# Patient Record
Sex: Male | Born: 1960 | Race: Black or African American | Hispanic: No | Marital: Married | Smoking: Former smoker
Health system: Southern US, Community
[De-identification: ages and names within clinical notes are randomized; demographics above are authoritative.]

## PROBLEM LIST (undated history)

## (undated) DIAGNOSIS — M2550 Pain in unspecified joint: Secondary | ICD-10-CM

## (undated) DIAGNOSIS — F329 Major depressive disorder, single episode, unspecified: Secondary | ICD-10-CM

## (undated) DIAGNOSIS — B356 Tinea cruris: Secondary | ICD-10-CM

## (undated) DIAGNOSIS — Z87448 Personal history of other diseases of urinary system: Secondary | ICD-10-CM

## (undated) DIAGNOSIS — F1011 Alcohol abuse, in remission: Secondary | ICD-10-CM

## (undated) DIAGNOSIS — K219 Gastro-esophageal reflux disease without esophagitis: Secondary | ICD-10-CM

## (undated) DIAGNOSIS — J45909 Unspecified asthma, uncomplicated: Secondary | ICD-10-CM

## (undated) DIAGNOSIS — D684 Acquired coagulation factor deficiency: Secondary | ICD-10-CM

## (undated) DIAGNOSIS — F341 Dysthymic disorder: Secondary | ICD-10-CM

## (undated) HISTORY — DX: Dysthymic disorder: F34.1

## (undated) HISTORY — DX: Personal history of other diseases of urinary system: Z87.448

## (undated) HISTORY — DX: Alcohol abuse, in remission: F10.11

## (undated) HISTORY — DX: Major depressive disorder, single episode, unspecified: F32.9

## (undated) HISTORY — DX: Gastro-esophageal reflux disease without esophagitis: K21.9

## (undated) HISTORY — DX: Pain in unspecified joint: M25.50

## (undated) HISTORY — DX: Tinea cruris: B35.6

## (undated) HISTORY — DX: Acquired coagulation factor deficiency: D68.4

---

## 2005-09-11 ENCOUNTER — Emergency Department (HOSPITAL_COMMUNITY): Admission: EM | Admit: 2005-09-11 | Discharge: 2005-09-11 | Payer: Self-pay | Admitting: Emergency Medicine

## 2006-01-25 ENCOUNTER — Ambulatory Visit: Payer: Self-pay | Admitting: Internal Medicine

## 2006-01-25 LAB — CONVERTED CEMR LAB
Basophils Absolute: 0.1 10*3/uL (ref 0.0–0.1)
Basophils Relative: 1.4 % — ABNORMAL HIGH (ref 0.0–1.0)
CO2: 29 meq/L (ref 19–32)
Calcium: 9.7 mg/dL (ref 8.4–10.5)
Chloride: 106 meq/L (ref 96–112)
Cholesterol: 184 mg/dL (ref 0–200)
Glucose, Bld: 83 mg/dL (ref 70–99)
HDL: 41.3 mg/dL (ref >39.0)
Hemoglobin: 15.2 g/dL (ref 13.0–17.0)
LDL Cholesterol: 129 mg/dL — ABNORMAL HIGH (ref 0–99)
Lymphocytes Relative: 34.1 % (ref 12.0–46.0)
MCV: 90.2 fL (ref 78.0–100.0)
Monocytes Absolute: 0.4 10*3/uL (ref 0.2–0.7)
Monocytes Relative: 10.3 % (ref 3.0–11.0)
Neutrophils Relative %: 49.5 % (ref 43.0–77.0)
Platelets: 194 10*3/uL (ref 150–400)
TSH: 1.01 microintl units/mL (ref 0.35–5.50)
Total CHOL/HDL Ratio: 4.5
Triglycerides: 69 mg/dL (ref 0–149)
WBC: 4.2 10*3/uL — ABNORMAL LOW (ref 4.5–10.5)

## 2006-02-02 ENCOUNTER — Ambulatory Visit: Payer: Self-pay | Admitting: Internal Medicine

## 2006-10-26 ENCOUNTER — Ambulatory Visit: Payer: Self-pay | Admitting: Internal Medicine

## 2007-03-30 ENCOUNTER — Ambulatory Visit: Payer: Self-pay | Admitting: Family Medicine

## 2007-04-03 DIAGNOSIS — J069 Acute upper respiratory infection, unspecified: Secondary | ICD-10-CM | POA: Insufficient documentation

## 2007-04-10 ENCOUNTER — Ambulatory Visit: Payer: Self-pay | Admitting: Internal Medicine

## 2007-05-16 ENCOUNTER — Ambulatory Visit: Payer: Self-pay | Admitting: Internal Medicine

## 2007-05-16 DIAGNOSIS — B356 Tinea cruris: Secondary | ICD-10-CM | POA: Insufficient documentation

## 2007-05-16 HISTORY — DX: Tinea cruris: B35.6

## 2007-07-11 ENCOUNTER — Ambulatory Visit: Payer: Self-pay | Admitting: Internal Medicine

## 2007-10-16 ENCOUNTER — Ambulatory Visit: Payer: Self-pay | Admitting: Internal Medicine

## 2008-02-19 ENCOUNTER — Ambulatory Visit: Payer: Self-pay | Admitting: Internal Medicine

## 2008-03-28 ENCOUNTER — Ambulatory Visit: Payer: Self-pay | Admitting: Internal Medicine

## 2008-03-28 DIAGNOSIS — J309 Allergic rhinitis, unspecified: Secondary | ICD-10-CM | POA: Insufficient documentation

## 2008-05-14 ENCOUNTER — Ambulatory Visit: Payer: Self-pay | Admitting: Family Medicine

## 2008-05-14 DIAGNOSIS — M549 Dorsalgia, unspecified: Secondary | ICD-10-CM | POA: Insufficient documentation

## 2008-09-13 ENCOUNTER — Ambulatory Visit: Payer: Self-pay | Admitting: Family Medicine

## 2008-09-13 ENCOUNTER — Telehealth: Payer: Self-pay | Admitting: Family Medicine

## 2008-09-13 DIAGNOSIS — K219 Gastro-esophageal reflux disease without esophagitis: Secondary | ICD-10-CM | POA: Insufficient documentation

## 2008-09-13 DIAGNOSIS — R079 Chest pain, unspecified: Secondary | ICD-10-CM | POA: Insufficient documentation

## 2008-09-13 HISTORY — DX: Gastro-esophageal reflux disease without esophagitis: K21.9

## 2008-10-23 ENCOUNTER — Ambulatory Visit: Payer: Self-pay | Admitting: Internal Medicine

## 2008-11-06 ENCOUNTER — Ambulatory Visit: Payer: Self-pay | Admitting: Internal Medicine

## 2008-11-06 LAB — CONVERTED CEMR LAB
ALT: 25 units/L (ref 0–53)
AST: 20 units/L (ref 0–37)
BUN: 9 mg/dL (ref 6–23)
Basophils Absolute: 0.1 10*3/uL (ref 0.0–0.1)
Bilirubin, Direct: 0 mg/dL (ref 0.0–0.3)
Blood in Urine, dipstick: NEGATIVE
Calcium: 9.4 mg/dL (ref 8.4–10.5)
Cholesterol: 229 mg/dL — ABNORMAL HIGH (ref 0–200)
Creatinine, Ser: 1 mg/dL (ref 0.4–1.5)
Eosinophils Relative: 6.4 % — ABNORMAL HIGH (ref 0.0–5.0)
GFR calc non Af Amer: 101.82 mL/min (ref >60)
Glucose, Bld: 92 mg/dL (ref 70–99)
Glucose, Urine, Semiquant: NEGATIVE
HDL: 42.5 mg/dL (ref >39.00)
Ketones, urine, test strip: NEGATIVE
Lymphocytes Relative: 29.5 % (ref 12.0–46.0)
Monocytes Relative: 11.7 % (ref 3.0–12.0)
Neutrophils Relative %: 51.1 % (ref 43.0–77.0)
Platelets: 150 10*3/uL (ref 150.0–400.0)
Potassium: 4.2 meq/L (ref 3.5–5.1)
Protein, U semiquant: NEGATIVE
RDW: 12.3 % (ref 11.5–14.6)
TSH: 1.23 microintl units/mL (ref 0.35–5.50)
Total Bilirubin: 1 mg/dL (ref 0.3–1.2)
Triglycerides: 111 mg/dL (ref 0.0–149.0)
VLDL: 22.2 mg/dL (ref 0.0–40.0)
WBC Urine, dipstick: NEGATIVE
WBC: 4.7 10*3/uL (ref 4.5–10.5)
pH: 5.5

## 2008-11-13 ENCOUNTER — Ambulatory Visit: Payer: Self-pay | Admitting: Internal Medicine

## 2008-11-13 DIAGNOSIS — M2550 Pain in unspecified joint: Secondary | ICD-10-CM

## 2008-11-13 DIAGNOSIS — R109 Unspecified abdominal pain: Secondary | ICD-10-CM | POA: Insufficient documentation

## 2008-11-13 HISTORY — DX: Pain in unspecified joint: M25.50

## 2008-11-14 ENCOUNTER — Encounter: Admission: RE | Admit: 2008-11-14 | Discharge: 2008-11-14 | Payer: Self-pay | Admitting: Internal Medicine

## 2008-11-18 ENCOUNTER — Encounter (INDEPENDENT_AMBULATORY_CARE_PROVIDER_SITE_OTHER): Payer: Self-pay | Admitting: *Deleted

## 2008-11-24 ENCOUNTER — Encounter (INDEPENDENT_AMBULATORY_CARE_PROVIDER_SITE_OTHER): Payer: Self-pay | Admitting: *Deleted

## 2008-12-04 ENCOUNTER — Telehealth: Payer: Self-pay | Admitting: Internal Medicine

## 2008-12-16 ENCOUNTER — Ambulatory Visit: Payer: Self-pay | Admitting: Gastroenterology

## 2009-01-07 ENCOUNTER — Ambulatory Visit: Payer: Self-pay | Admitting: Gastroenterology

## 2009-01-07 LAB — HM COLONOSCOPY

## 2009-01-13 ENCOUNTER — Encounter: Payer: Self-pay | Admitting: Gastroenterology

## 2009-01-20 ENCOUNTER — Ambulatory Visit: Payer: Self-pay | Admitting: Internal Medicine

## 2009-01-20 DIAGNOSIS — M65839 Other synovitis and tenosynovitis, unspecified forearm: Secondary | ICD-10-CM | POA: Insufficient documentation

## 2009-01-20 DIAGNOSIS — M65849 Other synovitis and tenosynovitis, unspecified hand: Secondary | ICD-10-CM

## 2009-03-09 ENCOUNTER — Ambulatory Visit: Payer: Self-pay | Admitting: Internal Medicine

## 2009-03-09 DIAGNOSIS — F329 Major depressive disorder, single episode, unspecified: Secondary | ICD-10-CM

## 2009-03-09 DIAGNOSIS — F3289 Other specified depressive episodes: Secondary | ICD-10-CM

## 2009-03-09 DIAGNOSIS — F341 Dysthymic disorder: Secondary | ICD-10-CM

## 2009-03-09 HISTORY — DX: Dysthymic disorder: F34.1

## 2009-03-09 HISTORY — DX: Major depressive disorder, single episode, unspecified: F32.9

## 2009-03-09 HISTORY — DX: Other specified depressive episodes: F32.89

## 2009-05-28 ENCOUNTER — Ambulatory Visit: Payer: Self-pay | Admitting: Internal Medicine

## 2009-05-28 DIAGNOSIS — Z87448 Personal history of other diseases of urinary system: Secondary | ICD-10-CM | POA: Insufficient documentation

## 2009-05-28 DIAGNOSIS — D684 Acquired coagulation factor deficiency: Secondary | ICD-10-CM | POA: Insufficient documentation

## 2009-05-28 DIAGNOSIS — R300 Dysuria: Secondary | ICD-10-CM | POA: Insufficient documentation

## 2009-05-28 HISTORY — DX: Acquired coagulation factor deficiency: D68.4

## 2009-05-28 HISTORY — DX: Personal history of other diseases of urinary system: Z87.448

## 2009-05-28 LAB — CONVERTED CEMR LAB
Bilirubin Urine: NEGATIVE
Ketones, urine, test strip: NEGATIVE
Nitrite: NEGATIVE
Specific Gravity, Urine: 1.01
Urobilinogen, UA: 0.2

## 2009-08-17 ENCOUNTER — Ambulatory Visit: Payer: Self-pay | Admitting: Family Medicine

## 2009-08-17 DIAGNOSIS — S139XXA Sprain of joints and ligaments of unspecified parts of neck, initial encounter: Secondary | ICD-10-CM | POA: Insufficient documentation

## 2009-11-11 ENCOUNTER — Ambulatory Visit: Payer: Self-pay | Admitting: Internal Medicine

## 2009-12-09 ENCOUNTER — Ambulatory Visit: Payer: Self-pay | Admitting: Internal Medicine

## 2010-01-19 ENCOUNTER — Ambulatory Visit
Admission: RE | Admit: 2010-01-19 | Discharge: 2010-01-19 | Payer: Self-pay | Source: Home / Self Care | Attending: Internal Medicine | Admitting: Internal Medicine

## 2010-02-02 NOTE — Assessment & Plan Note (Signed)
Summary: back pain/dm   Vital Signs:  Patient profile:   50 year old male Weight:      189 pounds Temp:     98.0 degrees F oral BP sitting:   110 / 80  (right arm) Cuff size:   regular  Vitals Entered By: Duard Brady LPN (November 11, 2009 9:20 AM) CC: c/o back pain and muscle cramps   no fall no injury Is Patient Diabetic? No   Primary Care Provider:  Gordy Savers  MD  CC:  c/o back pain and muscle cramps   no fall no injury.  History of Present Illness: 50 year old patient presents with a one week history upper interscapular back pain.  He has been on naproxen and has improved.  Pain is aggravated by movement.  He does get to the gym regularly, but he states he does warmup and stretch prior to exercise regimen.  He has had some upper back pain in the past and was seen 3 months ago with neck pain.  He has a history of depression, which has been stable.  He has gastro-esophageal reflux disease  Preventive Screening-Counseling & Management  Alcohol-Tobacco     Smoking Status: never  Allergies (verified): No Known Drug Allergies  Review of Systems  The patient denies anorexia, fever, weight loss, weight gain, vision loss, decreased hearing, hoarseness, chest pain, syncope, dyspnea on exertion, peripheral edema, prolonged cough, headaches, hemoptysis, abdominal pain, melena, hematochezia, severe indigestion/heartburn, hematuria, incontinence, genital sores, muscle weakness, suspicious skin lesions, transient blindness, difficulty walking, depression, unusual weight change, abnormal bleeding, enlarged lymph nodes, angioedema, breast masses, and testicular masses.    Physical Exam  General:  Well-developed,well-nourished,in no acute distress; alert,appropriate and cooperative throughout examination Head:  Normocephalic and atraumatic without obvious abnormalities. No apparent alopecia or balding. Msk:   slight discomfort in the interscapular musculature full range of  motion of the neck and shoulder areas   Impression & Recommendations:  Problem # 1:  BACK PAIN, UPPER (ICD-724.5)  His updated medication list for this problem includes:    Naproxen 500 Mg Tabs (Naproxen) ..... One by mouth two times a day with food as needed pain    Tramadol Hcl 50 Mg Tabs (Tramadol hcl) ..... One every 6 hours for pain    Cyclobenzaprine Hcl 10 Mg Tabs (Cyclobenzaprine hcl) ..... One at bedtime as needed for back pain  Problem # 2:  GERD (ICD-530.81)  His updated medication list for this problem includes:    Aciphex 20 Mg Tbec (Rabeprazole sodium) .Marland Kitchen... 1 once daily  Complete Medication List: 1)  Multivitamins Tabs (Multiple vitamin) .... Take 1 tablet by mouth once a day 2)  Aciphex 20 Mg Tbec (Rabeprazole sodium) .Marland Kitchen.. 1 once daily 3)  Ambi 60pse/400gfn 60-400 Mg Tabs (Pseudoephedrine-guaifenesin) .... One every 12 hours as needed for congestion or allergies 4)  Naproxen 500 Mg Tabs (Naproxen) .... One by mouth two times a day with food as needed pain 5)  Tramadol Hcl 50 Mg Tabs (Tramadol hcl) .... One every 6 hours for pain 6)  Cyclobenzaprine Hcl 10 Mg Tabs (Cyclobenzaprine hcl) .... One at bedtime as needed for back pain  Patient Instructions: 1)  Most patients (90%) with low back pain will improve with time (2-6 weeks). Keep active but avoid activities that are painful. Apply moist heat  to  back several times a day. Prescriptions: CYCLOBENZAPRINE HCL 10 MG TABS (CYCLOBENZAPRINE HCL) one at bedtime as needed for back pain  #30 x  0   Entered and Authorized by:   Gordy Savers  MD   Signed by:   Gordy Savers  MD on 11/11/2009   Method used:   Print then Give to Patient   RxID:   [id number] TRAMADOL HCL 50 MG TABS (TRAMADOL HCL) one every 6 hours for pain  #50 x 2   Entered and Authorized by:   Gordy Savers  MD   Signed by:   Gordy Savers  MD on 11/11/2009   Method used:   Print then Give to Patient   RxID:    [id number] NAPROXEN 500 MG TABS (NAPROXEN) one by mouth two times a day with food as needed pain  #50 x 4   Entered and Authorized by:   Gordy Savers  MD   Signed by:   Gordy Savers  MD on 11/11/2009   Method used:   Print then Give to Patient   RxID:   [id number]    Orders Added: 1)  Est. Patient Level III [84166]

## 2010-02-02 NOTE — Letter (Signed)
Summary: Results Letter  [facility]  [address] [place], [place] [address]   Phone: [phone]  Fax: [phone]        January 13, 2009 MRN: [id number]    [facility] [address] [place], [place]  [address]    Dear Jose [name],   The biopsies taken during your recent upper endoscopy showed no sign of infection or cancer.  You should continue to follow the recommnedations that we discussed at the time of your procedure (that is to continue taking aciphex one pill a day and avoid NSAID type pain medicines that can cause gastritis, ulcers).  Please feel free to call if you have any further questions or concerns.       Sincerely,  Rachael Fee MD  This letter has been electronically signed by your physician.  Appended Document: Results Letter Letter mailed 1.14.11

## 2010-02-02 NOTE — Assessment & Plan Note (Signed)
Summary: consult re: meds for depression/cjr   a a  Vital Signs:  Patient profile:   50 year old male Weight:      187 pounds Temp:     99.0 degrees F oral BP sitting:   92 / 66  (left arm) Cuff size:   regular  Vitals Entered By: Duard Brady LPN (March 09, 1608 3:31 PM) CC: c/o ?depression  Is Patient Diabetic? No   Primary Care Provider:  Gordy Savers  MD  CC:  c/o ?depression .  History of Present Illness: 50 year old patient presents with a several month history of worsening depression.  He and his  wife have  been receiving marital counseling and his therapist felt that he would benefit from an antidepressant.  He describes depressed mood, irritability, and anorexia.  No prior history of depression  Preventive Screening-Counseling & Management  Alcohol-Tobacco     Smoking Status: quit  Allergies (verified): No Known Drug Allergies  Past History:  Past Medical History: history of EtOH substance abuse GERD Abdominal pain Hyperlipidemia  Depression  Review of Systems       The patient complains of depression.  The patient denies anorexia, fever, weight loss, weight gain, vision loss, decreased hearing, hoarseness, chest pain, syncope, dyspnea on exertion, peripheral edema, prolonged cough, headaches, hemoptysis, abdominal pain, melena, hematochezia, severe indigestion/heartburn, hematuria, incontinence, genital sores, muscle weakness, suspicious skin lesions, transient blindness, difficulty walking, unusual weight change, abnormal bleeding, enlarged lymph nodes, angioedema, breast masses, and testicular masses.    Physical Exam  General:  Well-developed,well-nourished,in no acute distress; alert,appropriate and cooperative throughout examination Psych:  Cognition and judgment appear intact. Alert and cooperative with normal attention span and concentration. No apparent delusions, illusions, hallucinations   Impression & Recommendations:  Problem #  1:  DEPRESSION (ICD-311)  His updated medication list for this problem includes:    Sertraline Hcl 50 Mg Tabs (Sertraline hcl) ..... One tablet daily  Problem # 2:  ALLERGIC RHINITIS, SEASONAL, MILD (ICD-477.9)  Complete Medication List: 1)  Multivitamins Tabs (Multiple vitamin) .... Take 1 tablet by mouth once a day 2)  Aciphex 20 Mg Tbec (Rabeprazole sodium) .Marland Kitchen.. 1 once daily 3)  Meloxicam 7.5 Mg Tabs (Meloxicam) .... One daily 4)  Ambi 60pse/400gfn 60-400 Mg Tabs (Pseudoephedrine-guaifenesin) .... One every 12 hours as needed for congestion or allergies 5)  Sertraline Hcl 50 Mg Tabs (Sertraline hcl) .... One tablet daily  Other Orders: Prescription Created Electronically [id number])  Patient Instructions: 1)  Please schedule a follow-up appointment in 6 weeks 2)  It is important that you exercise regularly at least 20 minutes 5 times a week. If you develop chest pain, have severe difficulty breathing, or feel very tired , stop exercising immediately and seek medical attention. Prescriptions: AMBI 60PSE/400GFN 60-400 MG TABS (PSEUDOEPHEDRINE-GUAIFENESIN) one every 12 hours as needed for congestion or allergies  #180 x 3   Entered and Authorized by:   Gordy Savers  MD   Signed by:   Gordy Savers  MD on 03/09/2009   Method used:   Electronically to        [facility] [address] [phone][facility] (retail)       [address]       [place], [place]  [id number]       Ph: [phone] or [phone]       Fax: [phone]   RxID:   [id number] SERTRALINE HCL 50 MG TABS (  SERTRALINE HCL) one tablet daily  #90 x 1   Entered and Authorized by:   Gordy Savers  MD   Signed by:   Gordy Savers  MD on 03/09/2009   Method used:   [facility]        [facility] [address] [phone]* (retail)       [address]       [place], [place]  [id number]       Ph: [phone] or [phone]       Fax: [phone]   RxID:    [id number] AMBI 60PSE/400GFN 60-400 MG TABS (PSEUDOEPHEDRINE-GUAIFENESIN) one every 12 hours as needed for congestion or allergies  #180 x 3   Entered and Authorized by:   Gordy Savers  MD   Signed by:   Gordy Savers  MD on 03/09/2009   Method used:   [facility]        [facility] [facility] [address] [phone]* (retail)       [address]       [place], [place]  [id number]       Ph: [phone] or [phone]       Fax: [phone]   RxID:   [id number] SERTRALINE HCL 50 MG TABS (SERTRALINE HCL) one tablet daily  #90 x 0   Entered and Authorized by:   Gordy Savers  MD   Signed by:   Gordy Savers  MD on 03/09/2009   Method used:   Print then Give to Patient   RxID:   [id number] AMBI 60PSE/400GFN 60-400 MG TABS (PSEUDOEPHEDRINE-GUAIFENESIN) one every 12 hours as needed for congestion or allergies  #180 x 3   Entered and Authorized by:   Gordy Savers  MD   Signed by:   Gordy Savers  MD on 03/09/2009   Method used:   Print then Give to Patient   RxID:   [id number]

## 2010-02-02 NOTE — Procedures (Signed)
Summary: Upper Endoscopy  Patient: Jose [name] Note: All result statuses are Final unless otherwise noted.  Tests: (1) Upper Endoscopy (EGD)   EGD Upper Endoscopy       DONE     [facility]     [address]     [place], [place]  [address]           ENDOSCOPY PROCEDURE REPORT           PATIENT:  Jose [name], Jose [name]  MR#:  [id number]     BIRTHDATE:  03-30-1960, 48 yrs. old  GENDER:  male           ENDOSCOPIST:  Rachael Fee, MD     PROCEDURE DATE:  01/07/2009     PROCEDURE:  EGD with biopsy     ASA CLASS:  Class II     INDICATIONS:  GERD, dyspepsia           MEDICATIONS:  Fentanyl 25 mcg IV, Versed 2 mg IV, there was     residual sedation effect present from prior procedure     TOPICAL ANESTHETIC:  none           DESCRIPTION OF PROCEDURE:   After the risks benefits and     alternatives of the procedure were thoroughly explained, informed     consent was obtained.  The LB [id number]-H180 [id number] endoscope was     introduced through the mouth and advanced to the second portion of     the duodenum, without limitations.  The instrument was slowly     withdrawn as the mucosa was fully examined.     <<PROCEDUREIMAGES>>           There was mild, non-specific distal gastritis. This was biopsied     to check for H. pylori (see image1).  Otherwise the examination     was normal (see image6, image5, image3, and image2).     Retroflexed views revealed no abnormalities.    The scope was then     withdrawn from the patient and the procedure completed.           COMPLICATIONS:  None           ENDOSCOPIC IMPRESSION:     1) Mild, non-specific gastritis; biopsies taken to check for H.     pylori     2) Otherwise normal examination           RECOMMENDATIONS:     Continue taking aciphex once daily.  Cutting back on NSAID type     medicines will also probably help (over the counter pain medicines     except tylenol).     If biopsies show H. pylori, he will be started on the  appropriate antibiotics.           ______________________________     Rachael Fee, MD           cc: Eleonore Chiquito, MD           n.     eSIGNED:   Rachael Fee at 01/07/2009 03:00 PM           Roque Lias, [id number]  Note: An exclamation mark (!) indicates a result that was not dispersed into the flowsheet. Document Creation Date: 01/07/2009 3:00 PM _______________________________________________________________________  (1) Order result status: Final Collection or observation date-time: 01/07/2009 14:55 Requested date-time:  Receipt date-time:  Reported date-time:  Referring Physician:   Ordering Physician: Rob Bunting [phone]) Specimen Source:  Source: Launa Grill Order Number: [id number] Lab site:

## 2010-02-02 NOTE — Assessment & Plan Note (Signed)
Summary: blood in urine/cjr   Vital Signs:  Patient profile:   50 year old male Weight:      184 pounds Temp:     98.0 degrees F oral BP sitting:   110 / 74  (left arm) Cuff size:   regular  Vitals Entered By: Duard Brady LPN (May 28, 2009 11:18 AM) CC: c/o blood in urine noted since  Is Patient Diabetic? No   Primary Care Provider:  Gordy Savers  MD  CC:  c/o blood in urine noted since .  History of Present Illness: 50 year old patient was seen today concerned about possible hematuria.  He noted his urine yesterday was quite dark but improves with forcing fluids.  He states that 15 years ago, he had an evaluation for hematuria and he was concerned that this had re-occurred.  He denied  any abdominal or flank pain.  Denies any history of kidney stones.  He states his ear and again looked dark.  This morning, but has cleared.  A urinalysis was reviewed today and was normal. He has a history of depression, but self discontinued medication the to a feeling of somnolence.  He has felt well since discontinuation and does not feel depressed at this  time.  Preventive Screening-Counseling & Management  Alcohol-Tobacco     Smoking Status: never  Allergies (verified): No Known Drug Allergies  Social History: Smoking Status:  never  Physical Exam  General:  Well-developed,well-nourished,in no acute distress; alert,appropriate and cooperative throughout examination; normal blood pressure Abdomen:  no abdominal or flank tenderness   Impression & Recommendations:  Problem # 1:  HEMATURIA, HX OF (ICD-V13.09) doubt this represents true hematuria, probably just more concentrated urine.  Will check a UA at his next routine office visit  Problem # 2:  DEPRESSION (ICD-311)  His updated medication list for this problem includes:    Sertraline Hcl 50 Mg Tabs (Sertraline hcl) ..... One tablet daily stable off medication  His updated medication list for this problem  includes:    Sertraline Hcl 50 Mg Tabs (Sertraline hcl) ..... One tablet daily  Complete Medication List: 1)  Multivitamins Tabs (Multiple vitamin) .... Take 1 tablet by mouth once a day 2)  Aciphex 20 Mg Tbec (Rabeprazole sodium) .Marland Kitchen.. 1 once daily 3)  Meloxicam 7.5 Mg Tabs (Meloxicam) .... One daily 4)  Ambi 60pse/400gfn 60-400 Mg Tabs (Pseudoephedrine-guaifenesin) .... One every 12 hours as needed for congestion or allergies 5)  Sertraline Hcl 50 Mg Tabs (Sertraline hcl) .... One tablet daily  Other Orders: UA Dipstick w/o Micro (manual) ([address])  Patient Instructions: 1)  Please schedule a follow-up appointment as needed.  Laboratory Results   Urine Tests  Date/Time Received: May 28, 2009 12:25 PM  Date/Time Reported: May 28, 2009 12:25 PM   Routine Urinalysis   Color: straw Appearance: Clear Glucose: negative   (Normal Range: Negative) Bilirubin: negative   (Normal Range: Negative) Ketone: negative   (Normal Range: Negative) Spec. Gravity: 1.010   (Normal Range: 1.003-1.035) Blood: trace-lysed   (Normal Range: Negative) pH: 7.5   (Normal Range: 5.0-8.0) Protein: trace   (Normal Range: Negative) Urobilinogen: 0.2   (Normal Range: 0-1) Nitrite: negative   (Normal Range: Negative) Leukocyte Esterace: negative   (Normal Range: Negative)

## 2010-02-02 NOTE — Assessment & Plan Note (Signed)
Summary: consult re: right wrist pain/results from colonoscopy/cjr   Vital Signs:  Patient profile:   50 year old male Weight:      183 pounds BMI:     26.35 Temp:     99.0 degrees F oral BP sitting:   82 / 60  (left arm) Cuff size:   regular  Vitals Entered By: Raechel Ache, RN (January 20, 2009 1:31 PM)  Procedure Note  Injections: Date of onset: 11/08/2005 Duration of symptoms: 2 months Indication: chronic pain Consent signed: no  Procedure # 1: tendon sheath injection    Region: lateral    Location: right forearm    Technique: 24 g needle    Medication: 20 mg depomedrol    Anesthesia: 1% lidocaine w/o epinephrine    Comment: tolerated procedure well  Cleaned and prepped with: alcohol Wound dressing: neosporin and bandaid  CC: C/o R wrist pain Is Patient Diabetic? No   Primary Care Provider:  Gordy Savers  MD  CC:  C/o R wrist pain.  History of Present Illness:  50 year old patient who has a history of right wrist tendinitis is seen today complaining of persistent pain.  He has been evaluated  by rheumatology and has been prescribed a wrist brace, which has been helpful.  This is his dominant hand.  Pain is localized to the lateral aspect of the wrist.  Allergies: No Known Drug Allergies  Physical Exam  General:  Well-developed,well-nourished,in no acute distress; alert,appropriate and cooperative throughout examination Msk:  tenderness along the right lateral wrist area   Impression & Recommendations:  Problem # 1:  TENDINITIS, RIGHT WRIST (ICD-727.05)  Orders: Depo-Medrol 20mg  (J1020) Injection, Tendon / Ligament ([address])  Complete Medication List: 1)  Multivitamins Tabs (Multiple vitamin) .... Take 1 tablet by mouth once a day 2)  Aciphex 20 Mg Tbec (Rabeprazole sodium) .Marland Kitchen.. 1 once daily 3)  Meloxicam 7.5 Mg Tabs (Meloxicam) .... One daily  Patient Instructions: 1)  continues to use right wrist brace 2)  Take 400-600mg  of Ibuprofen  (Advil, Motrin) with food every 4-6 hours as needed for relief of pain or comfort of fever. 3)  Please schedule a follow-up appointment as needed. Prescriptions: MELOXICAM 7.5 MG TABS (MELOXICAM) one daily  #50 x 4   Entered and Authorized by:   Gordy Savers  MD   Signed by:   Gordy Savers  MD on 01/20/2009   Method used:   Print then Give to Patient   RxID:   [id number] ACIPHEX 20 MG TBEC (RABEPRAZOLE SODIUM) 1 once daily  #90 x 3   Entered and Authorized by:   Gordy Savers  MD   Signed by:   Gordy Savers  MD on 01/20/2009   Method used:   Print then Give to Patient   RxID:   [id number] ACIPHEX 20 MG TBEC (RABEPRAZOLE SODIUM) 1 once daily  #14 x 0   Entered and Authorized by:   Gordy Savers  MD   Signed by:   Gordy Savers  MD on 01/20/2009   Method used:   Print then Give to Patient   RxID:   [id number]

## 2010-02-02 NOTE — Assessment & Plan Note (Signed)
Summary: back pain/cjr   Vital Signs:  Patient profile:   50 year old male Weight:      191 pounds Temp:     98.4 degrees F oral BP sitting:   100 / 70  (right arm) Cuff size:   regular CC: c/o back pain getting worse   Is Patient Diabetic? No   Primary Care Provider:  Gordy Savers  MD  CC:  c/o back pain getting worse  .  History of Present Illness: 50 year old patient seen today for  follow-up of his low back pain.  He went to the gym 4 days ago, and the soft Monday, had some mild left upper back pain beneath the left scapula.  His been essentially pain-free for the past two days.  He has a history of depression, which has been stable.  No other concerns or complaints.  He does have some medications on hand for flareups of low back pain.  These were used and were very effective  Allergies (verified): No Known Drug Allergies  Past History:  Past Medical History: Reviewed history from 03/09/2009 and no changes required. history of EtOH substance abuse GERD Abdominal pain Hyperlipidemia  Depression  Review of Systems  The patient denies anorexia, fever, weight loss, weight gain, vision loss, decreased hearing, hoarseness, chest pain, syncope, dyspnea on exertion, peripheral edema, prolonged cough, headaches, hemoptysis, abdominal pain, melena, hematochezia, severe indigestion/heartburn, hematuria, incontinence, genital sores, muscle weakness, suspicious skin lesions, transient blindness, difficulty walking, depression, unusual weight change, abnormal bleeding, enlarged lymph nodes, angioedema, breast masses, and testicular masses.    Physical Exam  General:  Well-developed,well-nourished,in no acute distress; alert,appropriate and cooperative throughout examination Head:  Normocephalic and atraumatic without obvious abnormalities. No apparent alopecia or balding. Mouth:  Oral mucosa and oropharynx without lesions or exudates.  Teeth in good repair. Neck:  No  deformities, masses, or tenderness noted. Lungs:  Normal respiratory effort, chest expands symmetrically. Lungs are clear to auscultation, no crackles or wheezes. Heart:  Normal rate and regular rhythm. S1 and S2 normal without gallop, murmur, click, rub or other extra sounds. Abdomen:  Bowel sounds positive,abdomen soft and non-tender without masses, organomegaly or hernias noted.   Impression & Recommendations:  Problem # 1:  BACK PAIN, UPPER (ICD-724.5)  His updated medication list for this problem includes:    Naproxen 500 Mg Tabs (Naproxen) ..... One by mouth two times a day with food as needed pain    Tramadol Hcl 50 Mg Tabs (Tramadol hcl) ..... One every 6 hours for pain    Cyclobenzaprine Hcl 10 Mg Tabs (Cyclobenzaprine hcl) ..... One at bedtime as needed for back pain  His updated medication list for this problem includes:    Naproxen 500 Mg Tabs (Naproxen) ..... One by mouth two times a day with food as needed pain    Tramadol Hcl 50 Mg Tabs (Tramadol hcl) ..... One every 6 hours for pain    Cyclobenzaprine Hcl 10 Mg Tabs (Cyclobenzaprine hcl) ..... One at bedtime as needed for back pain  Problem # 2:  ANXIETY DEPRESSION (ICD-300.4)  Complete Medication List: 1)  Multivitamins Tabs (Multiple vitamin) .... Take 1 tablet by mouth once a day 2)  Aciphex 20 Mg Tbec (Rabeprazole sodium) .Marland Kitchen.. 1 once daily 3)  Ambi 60pse/400gfn 60-400 Mg Tabs (Pseudoephedrine-guaifenesin) .... One every 12 hours as needed for congestion or allergies 4)  Naproxen 500 Mg Tabs (Naproxen) .... One by mouth two times a day with food as needed pain  5)  Tramadol Hcl 50 Mg Tabs (Tramadol hcl) .... One every 6 hours for pain 6)  Cyclobenzaprine Hcl 10 Mg Tabs (Cyclobenzaprine hcl) .... One at bedtime as needed for back pain  Patient Instructions: 1)  Please schedule a follow-up appointment in 4 months for CPX 2)  It is important that you exercise regularly at least 20 minutes 5 times a week. If you develop  chest pain, have severe difficulty breathing, or feel very tired , stop exercising immediately and seek medical attention.   Orders Added: 1)  Est. Patient Level III [[address]]

## 2010-02-02 NOTE — Procedures (Signed)
Summary: Colonoscopy  Patient: [name] Note: All result statuses are Final unless otherwise noted.  Tests: (1) Colonoscopy (COL)   COL Colonoscopy           DONE     [facility]     [address]     [place], [place]  [address]           COLONOSCOPY PROCEDURE REPORT           PATIENT:  Jose [name], Jose [name]  MR#:  [id number]     BIRTHDATE:  05-21-1960, 48 yrs. old  GENDER:  male           ENDOSCOPIST:  Rachael Fee, MD     Referred by:  Eleonore Chiquito, M.D.           PROCEDURE DATE:  01/07/2009     PROCEDURE:  Colonoscopy, Diagnostic     ASA CLASS:  Class II     INDICATIONS:  constipation           MEDICATIONS:   Fentanyl 50 mcg IV, Versed 5 mg IV           DESCRIPTION OF PROCEDURE:   After the risks benefits and     alternatives of the procedure were thoroughly explained, informed     consent was obtained.  Digital rectal exam was performed and     revealed no rectal masses.   The LB CF-[id number] endoscope     was introduced through the anus and advanced to the cecum, which     was identified by both the appendix and ileocecal valve, without     limitations.  The quality of the prep was good, using MoviPrep.     The instrument was then slowly withdrawn as the colon was fully     examined.     <<PROCEDUREIMAGES>>           FINDINGS:  A normal appearing cecum, ileocecal valve, and     appendiceal orifice were identified. The ascending, hepatic     flexure, transverse, splenic flexure, descending, sigmoid colon,     and rectum appeared unremarkable (see image1, image3, and image4).     Retroflexed views in the rectum revealed no abnormalities.    The     scope was then withdrawn from the patient and the procedure     completed.           COMPLICATIONS:  None           ENDOSCOPIC IMPRESSION:     1) Normal colon     2) No polyps or cancers           RECOMMENDATIONS:     Continue current colorectal screening recommendations for     "routine  risk" patients with a repeat colonoscopy in 10 years.     Try daily fiber supplement (citrucel) for your mild     constipation.           REPEAT EXAM:  10 years           ______________________________     Rachael Fee, MD           n.     eSIGNED:   Rachael Fee at 01/07/2009 02:51 PM           Roque Lias, [id number]  Note: An exclamation mark (!) indicates a result that was not dispersed into the flowsheet. Document Creation Date: 01/07/2009 2:51 PM _______________________________________________________________________  (1) Order result  status: Final Collection or observation date-time: 01/07/2009 14:47 Requested date-time:  Receipt date-time:  Reported date-time:  Referring Physician:   Ordering Physician: Rob Bunting [phone]) Specimen Source:  Source: Launa Grill Order Number: [id number] Lab site:   Appended Document: Colonoscopy    Clinical Lists Changes  Observations: Added new observation of COLONNXTDUE: 01/2019 (01/07/2009 15:16)

## 2010-02-02 NOTE — Assessment & Plan Note (Signed)
Summary: neck pain//ccm   Vital Signs:  Patient profile:   50 year old male Temp:     97.8 degrees F oral BP sitting:   110 / 80  (left arm) Cuff size:   regular  Vitals Entered By: Sid Falcon LPN (August 17, 2009 11:59 AM) CC: Right neck pain X 2 days   History of Present Illness: 2 day hx of L neck pain.   Pain correlates with sternocleidomastoid muscle. No injury.  Achy quality.  Worse with swallowing but no dysphagia.  Moderated severity. Has not taken any meds.  No appetite or weight change.  No fever.  Allergies (verified): No Known Drug Allergies  Past History:  Past Medical History: Last updated: 03/09/2009 history of EtOH substance abuse GERD Abdominal pain Hyperlipidemia  Depression PMH reviewed for relevance  Review of Systems  The patient denies anorexia, fever, weight loss, hoarseness, chest pain, dyspnea on exertion, and headaches.    Physical Exam  General:  Well-developed,well-nourished,in no acute distress; alert,appropriate and cooperative throughout examination Head:  no visible edema. Ears:  External ear exam shows no significant lesions or deformities.  Otoscopic examination reveals clear canals, tympanic membranes are intact bilaterally without bulging, retraction, inflammation or discharge. Hearing is grossly normal bilaterally. Mouth:  Oral mucosa and oropharynx without lesions or exudates.  Teeth in good repair. Neck:  slightl tender sternocleidomasoid muscle.  No adenopathy or any other mass. Lungs:  Normal respiratory effort, chest expands symmetrically. Lungs are clear to auscultation, no crackles or wheezes. Heart:  Normal rate and regular rhythm. S1 and S2 normal without gallop, murmur, click, rub or other extra sounds. Skin:  no rashes.   Cervical Nodes:  No lymphadenopathy noted   Impression & Recommendations:  Problem # 1:  CERVICAL MUSCLE STRAIN (ICD-847.0) try topical rub and Naproxen. The following medications were removed  from the medication list:    Meloxicam 7.5 Mg Tabs (Meloxicam) ..... One daily His updated medication list for this problem includes:    Naproxen 500 Mg Tabs (Naproxen) ..... One by mouth two times a day with food as needed pain  Complete Medication List: 1)  Multivitamins Tabs (Multiple vitamin) .... Take 1 tablet by mouth once a day 2)  Aciphex 20 Mg Tbec (Rabeprazole sodium) .Marland Kitchen.. 1 once daily 3)  Ambi 60pse/400gfn 60-400 Mg Tabs (Pseudoephedrine-guaifenesin) .... One every 12 hours as needed for congestion or allergies 4)  Naproxen 500 Mg Tabs (Naproxen) .... One by mouth two times a day with food as needed pain  Patient Instructions: 1)  Consider use of topical rub such as Icy Hot 2)  Follow up with your primary if no better in 2 weeks. Prescriptions: NAPROXEN 500 MG TABS (NAPROXEN) one by mouth two times a day with food as needed pain  #30 x 0   Entered and Authorized by:   Evelena Peat MD   Signed by:   Evelena Peat MD on 08/17/2009   Method used:   [facility]  [address] [id number]       [address]       [place], [place]  [address]       Ph: [phone] or [phone]       Fax: [phone]   RxID:   [id number]

## 2010-02-04 NOTE — Assessment & Plan Note (Signed)
Summary: RASH // RS   Vital Signs:  Patient profile:   50 year old male Weight:      189 pounds Temp:     98.6 degrees F oral BP sitting:   102 / 78  Vitals Entered By: Lynann Beaver CMA AAMA (January 19, 2010 2:55 PM) CC: rash Is Patient Diabetic? No Pain Assessment Patient in pain? no        Primary Care Provider:  Gordy Savers  MD  CC:  rash.  History of Present Illness: 68 -year-old patient who is seen to chief complaint of a rash in the inguinal region.This started yesterday and he has been using some topical medication and actually feels it is improved.  Today.  He has had a history of dermatophytosis involving the groin and perineal area.  Otherwise, doing quite well.  He is presently not taken any chronic medication.  He has a history of depression, which has been stable  Current Medications (verified): 1)  Ambi 60pse/400gfn 60-400 Mg Tabs (Pseudoephedrine-Guaifenesin) .... One Every 12 Hours As Needed For Congestion or Allergies  Allergies (verified): No Known Drug Allergies  Past History:  Past Medical History: Reviewed history from 03/09/2009 and no changes required. history of EtOH substance abuse GERD Abdominal pain Hyperlipidemia  Depression  Past Surgical History: Reviewed history from 12/16/2008 and no changes required. Denies surgical history   Review of Systems       The patient complains of suspicious skin lesions.  The patient denies anorexia, fever, weight loss, weight gain, vision loss, decreased hearing, hoarseness, chest pain, syncope, dyspnea on exertion, peripheral edema, prolonged cough, headaches, hemoptysis, abdominal pain, hematochezia, severe indigestion/heartburn, hematuria, incontinence, genital sores, muscle weakness, transient blindness, difficulty walking, depression, unusual weight change, abnormal bleeding, enlarged lymph nodes, angioedema, breast masses, and testicular masses.    Physical Exam  General:   Well-developed,well-nourished,in no acute distress; alert,appropriate and cooperative throughout examination Skin:  mild areas of hyperpigmentation in the intertriginous areas of the groin especially junction of the left upper inner thigh and scrotal region   Impression & Recommendations:  Problem # 1:  DERMATOPHYTOSIS OF GROIN AND PERIANAL AREA (ICD-110.3)  Problem # 2:  ANXIETY DEPRESSION (ICD-300.4) stable  Complete Medication List: 1)  Ambi 60pse/400gfn 60-400 Mg Tabs (Pseudoephedrine-guaifenesin) .... One every 12 hours as needed for congestion or allergies 2)  Nystatin-triamcinolone 100000-0.1 Unit/gm-% Crea (Nystatin-triamcinolone) .... Apply  twice daily  Patient Instructions: 1)  Please schedule a follow-up appointment as needed. Prescriptions: NYSTATIN-TRIAMCINOLONE 100000-0.1 UNIT/GM-% CREA (NYSTATIN-TRIAMCINOLONE) apply  twice daily  #60 gm x 3   Entered and Authorized by:   Gordy Savers  MD   Signed by:   Gordy Savers  MD on 01/19/2010   Method used:   [facility] [address] [id number]       [address]       [place], [place]  [address]       Ph: [phone] or [phone]       Fax: [phone]   RxID:   [id number] NYSTATIN-TRIAMCINOLONE 100000-0.1 UNIT/GM-% CREA (NYSTATIN-TRIAMCINOLONE) apply  twice daily  #60 gm x 0   Entered and Authorized by:   Gordy Savers  MD   Signed by:   Gordy Savers  MD on 01/19/2010   Method used:   Print then Give to Patient   RxID:   [id number]    Orders Added: 1)  Est. Patient Level III [[address]]

## 2010-02-18 ENCOUNTER — Telehealth: Payer: Self-pay

## 2010-02-18 NOTE — Telephone Encounter (Signed)
Mucinex DM 1 po BID prn cough and congestion #30 [facility]RF [facility]> KIK

## 2010-02-18 NOTE — Telephone Encounter (Signed)
What medication is no longer made??

## 2010-02-18 NOTE — Telephone Encounter (Signed)
Requesting new med - AMBI no longer made.   Please advise

## 2010-02-18 NOTE — Telephone Encounter (Signed)
This is the generic of  Mucinex D which is available over-the-counter;  Okay to call in Rx for number 30, refill x 3

## 2010-02-18 NOTE — Telephone Encounter (Signed)
AMBI60PSE/400GFN - it is listed in his centricity med list

## 2010-02-19 ENCOUNTER — Telehealth: Payer: Self-pay | Admitting: Internal Medicine

## 2010-02-19 NOTE — Telephone Encounter (Signed)
Pt called to adv that [facility] [address] adv pt that they no longer carry med: ambi.... Will need replacement med / Rx sent to pharmacy.

## 2010-02-19 NOTE — Telephone Encounter (Signed)
Called pt - informed that we called pharmacy yesterday - mucinex d would be the replacement for [facility] - gave order , but since OTC , they/ins may not fill. KIK

## 2010-03-30 ENCOUNTER — Encounter: Payer: Self-pay | Admitting: Internal Medicine

## 2010-03-30 ENCOUNTER — Ambulatory Visit (INDEPENDENT_AMBULATORY_CARE_PROVIDER_SITE_OTHER): Payer: 59 | Admitting: Internal Medicine

## 2010-03-30 VITALS — BP 110/74 | Temp 98.4°F | Ht 70.0 in | Wt 190.0 lb

## 2010-03-30 DIAGNOSIS — J069 Acute upper respiratory infection, unspecified: Secondary | ICD-10-CM

## 2010-03-30 NOTE — Progress Notes (Signed)
  Subjective:    Patient ID: Jose [name], male    DOB: 04-13-1960, 50 y.o.   MRN: [id number]  HPI   50 year old patient who presents with a five-day history of head and chest congestion. He has been taking Mucinex DM as well as Mucinex D. He feels there's been low-grade fever. There's been little sputum production that is fairly clear. No chest pain shortness of breath wheezing.   Review of Systems  Constitutional: Negative for fever, chills, appetite change and fatigue.  HENT: Positive for congestion. Negative for hearing loss, ear pain, sore throat, trouble swallowing, neck stiffness, dental problem, voice change and tinnitus.   Eyes: Negative for pain, discharge and visual disturbance.  Respiratory: Positive for cough. Negative for chest tightness, wheezing and stridor.   Cardiovascular: Negative for chest pain, palpitations and leg swelling.  Gastrointestinal: Negative for nausea, vomiting, abdominal pain, diarrhea, constipation, blood in stool and abdominal distention.  Genitourinary: Negative for urgency, hematuria, flank pain, discharge, difficulty urinating and genital sores.  Musculoskeletal: Negative for myalgias, back pain, joint swelling, arthralgias and gait problem.  Skin: Negative for rash.  Neurological: Negative for dizziness, syncope, speech difficulty, weakness, numbness and headaches.  Hematological: Negative for adenopathy. Does not bruise/bleed easily.  Psychiatric/Behavioral: Negative for behavioral problems and dysphoric mood. The patient is not nervous/anxious.        Objective:   Physical Exam  Constitutional: He is oriented to person, place, and time. He appears well-developed.  HENT:  Head: Normocephalic.  Right Ear: External ear normal.  Left Ear: External ear normal.  Eyes: Conjunctivae and EOM are normal.  Neck: Normal range of motion.  Cardiovascular: Normal rate and normal heart sounds.   Pulmonary/Chest: Breath sounds normal.  Abdominal: Bowel  sounds are normal.  Musculoskeletal: Normal range of motion. He exhibits no edema and no tenderness.  Neurological: He is alert and oriented to person, place, and time.  Psychiatric: He has a normal mood and affect. His behavior is normal.          Assessment & Plan:   viral URI. We'll continue the Mucinex DM. If this is not effective will switch to [employer]

## 2010-03-30 NOTE — Patient Instructions (Signed)
Get plenty of rest, Drink lots of  clear liquids, and use Tylenol or ibuprofen for fever and discomfort.    Call or return to clinic prn if these symptoms worsen or fail to improve as anticipated.  

## 2010-04-07 ENCOUNTER — Other Ambulatory Visit (INDEPENDENT_AMBULATORY_CARE_PROVIDER_SITE_OTHER): Payer: 59 | Admitting: Internal Medicine

## 2010-04-07 DIAGNOSIS — Z0000 Encounter for general adult medical examination without abnormal findings: Secondary | ICD-10-CM

## 2010-04-07 DIAGNOSIS — E785 Hyperlipidemia, unspecified: Secondary | ICD-10-CM

## 2010-04-07 LAB — POCT URINALYSIS DIPSTICK
Bilirubin, UA: NEGATIVE
Glucose, UA: NEGATIVE
Leukocytes, UA: NEGATIVE
Nitrite, UA: NEGATIVE
pH, UA: 5

## 2010-04-07 LAB — BASIC METABOLIC PANEL
Calcium: 9.5 mg/dL (ref 8.4–10.5)
GFR: 94.66 mL/min (ref >60.00)
Glucose, Bld: 78 mg/dL (ref 70–99)
Potassium: 4.6 mEq/L (ref 3.5–5.1)
Sodium: 141 mEq/L (ref 135–145)

## 2010-04-07 LAB — CBC WITH DIFFERENTIAL/PLATELET
Basophils Relative: 0.9 % (ref 0.0–3.0)
Eosinophils Relative: 4.9 % (ref 0.0–5.0)
HCT: 44.2 % (ref 39.0–52.0)
Hemoglobin: 15 g/dL (ref 13.0–17.0)
Lymphocytes Relative: 29.9 % (ref 12.0–46.0)
Lymphs Abs: 2 10*3/uL (ref 0.7–4.0)
Monocytes Relative: 10.7 % (ref 3.0–12.0)
Neutro Abs: 3.6 10*3/uL (ref 1.4–7.7)
RBC: 4.76 Mil/uL (ref 4.22–5.81)

## 2010-04-07 LAB — LDL CHOLESTEROL, DIRECT: Direct LDL: 160.9 mg/dL

## 2010-04-07 LAB — LIPID PANEL
Cholesterol: 218 mg/dL — ABNORMAL HIGH (ref 0–200)
VLDL: 19 mg/dL (ref 0.0–40.0)

## 2010-04-07 LAB — HEPATIC FUNCTION PANEL
ALT: 26 U/L (ref 0–53)
AST: 23 U/L (ref 0–37)
Albumin: 4 g/dL (ref 3.5–5.2)
Alkaline Phosphatase: 51 U/L (ref 39–117)
Total Protein: 6.9 g/dL (ref 6.0–8.3)

## 2010-04-08 LAB — PSA: PSA: 5.17 ng/mL — ABNORMAL HIGH (ref 0.10–4.00)

## 2010-04-12 NOTE — Progress Notes (Signed)
Quick Note:  Pt is scheduled for Wed. 04/14/10 ______

## 2010-04-13 ENCOUNTER — Encounter: Payer: Self-pay | Admitting: Internal Medicine

## 2010-04-14 ENCOUNTER — Encounter: Payer: Self-pay | Admitting: Internal Medicine

## 2010-04-14 ENCOUNTER — Ambulatory Visit (INDEPENDENT_AMBULATORY_CARE_PROVIDER_SITE_OTHER): Payer: 59 | Admitting: Internal Medicine

## 2010-04-14 VITALS — BP 106/70 | HR 73 | Temp 98.8°F | Resp 18 | Ht 69.5 in | Wt 192.0 lb

## 2010-04-14 DIAGNOSIS — R972 Elevated prostate specific antigen [PSA]: Secondary | ICD-10-CM

## 2010-04-14 DIAGNOSIS — Z0000 Encounter for general adult medical examination without abnormal findings: Secondary | ICD-10-CM

## 2010-04-14 NOTE — Patient Instructions (Signed)
Urology followup as discussed  Return in one year or when necessary Heart healthy diet   It is important that you exercise regularly, at least 20 minutes 3 to 4 times per week.  If you develop chest pain or shortness of breath seek  medical attention.

## 2010-04-14 NOTE — Progress Notes (Signed)
  Subjective:    Patient ID: Jose [name], male    DOB: 13-May-1960, 50 y.o.   MRN: [id number]  HPI  50 year old patient who is seen today for a health maintenance examination. He did have screening colonoscopy approximately one year ago. No other concerns or complaints today. He takes no chronic medications. He does have a history of gastroesophageal reflux disease and also had upper panendoscopy performed in January of last year. Laboratory studies were reviewed and did reveal an elevated PSA.   Review of Systems  Constitutional: Negative for fever, chills, activity change, appetite change and fatigue.  HENT: Negative for hearing loss, ear pain, congestion, rhinorrhea, sneezing, mouth sores, trouble swallowing, neck pain, neck stiffness, dental problem, voice change, sinus pressure and tinnitus.   Eyes: Negative for photophobia, pain, redness and visual disturbance.  Respiratory: Negative for apnea, cough, choking, chest tightness, shortness of breath and wheezing.   Cardiovascular: Negative for chest pain, palpitations and leg swelling.  Gastrointestinal: Negative for nausea, vomiting, abdominal pain, diarrhea, constipation, blood in stool, abdominal distention, anal bleeding and rectal pain.  Genitourinary: Negative for dysuria, urgency, frequency, hematuria, flank pain, decreased urine volume, discharge, penile swelling, scrotal swelling, difficulty urinating, genital sores and testicular pain.  Musculoskeletal: Negative for myalgias, back pain, joint swelling, arthralgias and gait problem.  Skin: Negative for color change, rash and wound.  Neurological: Negative for dizziness, tremors, seizures, syncope, facial asymmetry, speech difficulty, weakness, light-headedness, numbness and headaches.  Hematological: Negative for adenopathy. Does not bruise/bleed easily.  Psychiatric/Behavioral: Negative for suicidal ideas, hallucinations, behavioral problems, confusion, sleep disturbance,  self-injury, dysphoric mood, decreased concentration and agitation. The patient is not nervous/anxious.        Objective:   Physical Exam  Constitutional: He appears well-developed and well-nourished.  HENT:  Head: Normocephalic and atraumatic.  Right Ear: External ear normal.  Left Ear: External ear normal.  Nose: Nose normal.  Mouth/Throat: Oropharynx is clear and moist.  Eyes: Conjunctivae and EOM are normal. Pupils are equal, round, and reactive to light. No scleral icterus.  Neck: Normal range of motion. Neck supple. No JVD present. No thyromegaly present.  Cardiovascular: Regular rhythm, normal heart sounds and intact distal pulses.  Exam reveals no gallop and no friction rub.   No murmur heard. Pulmonary/Chest: Effort normal and breath sounds normal. He exhibits no tenderness.  Abdominal: Soft. Bowel sounds are normal. He exhibits no distension and no mass. There is no tenderness.  Genitourinary: Rectum normal and penis normal.       Patient was not able to relax for the examination and prostate examination was not adequate  Musculoskeletal: Normal range of motion. He exhibits no edema and no tenderness.  Lymphadenopathy:    He has no cervical adenopathy.  Neurological: He is alert. He has normal reflexes. No cranial nerve deficit. Coordination normal.  Skin: Skin is warm and dry. No rash noted.  Psychiatric: He has a normal mood and affect. His behavior is normal.          Assessment & Plan:  Annual health assessment Elevated PSA  Options were discussed including referral to a urologist.  Heart healthy diet more regular  exercise regimen encouraged

## 2010-04-21 ENCOUNTER — Ambulatory Visit (INDEPENDENT_AMBULATORY_CARE_PROVIDER_SITE_OTHER): Payer: 59 | Admitting: Family Medicine

## 2010-04-21 ENCOUNTER — Encounter: Payer: Self-pay | Admitting: Family Medicine

## 2010-04-21 VITALS — BP 90/60 | Temp 98.6°F

## 2010-04-21 DIAGNOSIS — R05 Cough: Secondary | ICD-10-CM

## 2010-04-21 DIAGNOSIS — R059 Cough, unspecified: Secondary | ICD-10-CM

## 2010-04-21 MED ORDER — AZITHROMYCIN 250 MG PO TABS: ORAL_TABLET | ORAL | Status: DC

## 2010-04-21 NOTE — Progress Notes (Signed)
  Subjective:    Patient ID: Jose [name], male    DOB: 08/07/1960, 50 y.o.   MRN: [id number]  HPI Patient seen with recurrent cough. Had cough couple weeks ago and eventually improved now past few days cough productive of yellow sputum. No fever or chills. Has some chest wall soreness which he thinks is related to coughing. No dyspnea. No hemoptysis. No real pleuritic pain. No exertional symptoms.  Patient is an ex- smoker. Denies any appetite or weight changes.  Also has occasional reflux symptoms. Has used Pepcid in the past with good success. He is encouraged to back on this.   Review of Systems  Constitutional: Negative for fever, chills, activity change, appetite change, fatigue and unexpected weight change.  HENT: Negative for ear pain, congestion, sore throat and trouble swallowing.   Respiratory: Positive for cough. Negative for shortness of breath, wheezing and stridor.   Cardiovascular: Negative for chest pain and leg swelling.  Gastrointestinal: Negative for abdominal pain.  Musculoskeletal: Negative for arthralgias.  Skin: Negative for rash.  Neurological: Negative for syncope and headaches.  Hematological: Negative for adenopathy.       Objective:   Physical Exam  Constitutional: He is oriented to person, place, and time. He appears well-developed and well-nourished. No distress.  HENT:  Head: Normocephalic and atraumatic.  Eyes: Pupils are equal, round, and reactive to light.  Neck: Normal range of motion. Neck supple. No thyromegaly present.  Cardiovascular: Normal rate and regular rhythm.   No murmur heard. Pulmonary/Chest: Effort normal. No stridor. No respiratory distress. He has no wheezes. He has no rales. He exhibits tenderness.  Abdominal: Soft. Bowel sounds are normal. There is no tenderness.  Musculoskeletal: He exhibits no edema.  Lymphadenopathy:    He has no cervical adenopathy.  Neurological: He is alert and oriented to person, place, and time.    Psychiatric: He has a normal mood and affect.          Assessment & Plan:  Cough probably related to acute bronchitis. Given duration of symptoms off and on and start Zithromax. Followup promptly for fever or worsening symptoms

## 2010-05-21 NOTE — Assessment & Plan Note (Signed)
[facility] OFFICE NOTE   HAWTHORNE, DAY                      MRN:          [id number]  DATE:02/02/2006                            DOB:          1960-07-21    A 50 year old [place] American male seen today to establish with our  practice.  He has relocated from [place] about 6 months ago and works  with the [employer].  He has no real concerns or complaints today.  He states that he has a history of alcohol and drug abuse, but has been  clean for 12 years.  Has been evaluated for some cervical arthritis but  seems fairly symptom free.  Likewise, did have some allergy related  issues up in [place], but has done well since his relocation.  He  takes no chronic medications, no surgeries.  He did have a broken right  fibula 2 years ago.  NO ALLERGIES.   REVIEW OF SYSTEMS:  Negative.   FAMILY HISTORY:  Both parents died at 49.  Father from lung cancer.  Mother died of complications of hypertension and chronic kidney disease,  she also has history of asthma.  Four sisters, fairly good health.   EXAMINATION:  Revealed a well-developed fit appearing male in no acute  distress.  Blood pressure was less than 120/80.  FUNDI, EAR, NOSE, AND THROAT:  Clear.  NECK:  No bruits or adenopathy, no thyroid enlargement.  CHEST:  Clear.  CARDIOVASCULAR:  Normal heart sounds, no murmurs.  ABDOMEN:  Benign, no organomegaly.  EXTERNAL GENITALIA:  Normal.  EXTREMITIES:  Full peripheral pulses, no edema.  NEURO:  Negative.   IMPRESSION:  Unremarkable clinical exam.  History of seasonal allergic  rhinitis.  History of substance abuse.  History of cervical degenerative  joint disease.   DISPOSITION:  Laboratory studies were reviewed, these were unremarkable.  Cholesterol 184, blood sugar 83, PSA less than 2.  Will reassess in 1-2  years or p.r.n.     Gordy Savers, MD  Electronically Signed    PFK/MedQ   DD: 02/02/2006  DT: 02/02/2006  Job #: [id number]

## 2010-06-24 ENCOUNTER — Encounter (HOSPITAL_BASED_OUTPATIENT_CLINIC_OR_DEPARTMENT_OTHER): Payer: 59 | Admitting: Oncology

## 2010-06-24 DIAGNOSIS — C61 Malignant neoplasm of prostate: Secondary | ICD-10-CM

## 2010-07-21 ENCOUNTER — Ambulatory Visit (INDEPENDENT_AMBULATORY_CARE_PROVIDER_SITE_OTHER): Payer: 59 | Admitting: Internal Medicine

## 2010-07-21 ENCOUNTER — Encounter: Payer: Self-pay | Admitting: Internal Medicine

## 2010-07-21 VITALS — BP 112/80 | Temp 98.2°F | Wt 189.0 lb

## 2010-07-21 DIAGNOSIS — C61 Malignant neoplasm of prostate: Secondary | ICD-10-CM | POA: Insufficient documentation

## 2010-07-21 NOTE — Patient Instructions (Signed)
Urology followup  Call or return to clinic prn if these symptoms worsen or fail to improve as anticipated.

## 2010-07-21 NOTE — Progress Notes (Signed)
  Subjective:    Patient ID: Jose [name], male    DOB: 11-Feb-1960, 50 y.o.   MRN: [id number]  HPI  50 year old patient who has a recent diagnosis of prostate cancer. He has been evaluated also by radiation oncology and he is contemplating his options. He wishes to discuss his diagnosis further. Pluses and minuses of watchful waiting surgery or radiation treatment all discussed and all questions answered.    Review of Systems     Objective:   Physical Exam        Assessment & Plan:  Prostate cancer. Apparently one of 12 biopsy specimens were positive. Gleason score unknown. He is being followed by urology who has given him the option of watchful waiting. He is scheduled for urology followup serum. All questions answered and his diagnosis discussed at length

## 2010-07-22 MED ORDER — RABEPRAZOLE SODIUM 20 MG PO TBEC: 20.0000 mg | DELAYED_RELEASE_TABLET | Freq: Every day | ORAL | Status: AC

## 2010-09-03 ENCOUNTER — Other Ambulatory Visit: Payer: Self-pay

## 2010-09-03 ENCOUNTER — Telehealth: Payer: Self-pay

## 2010-09-03 NOTE — Telephone Encounter (Signed)
Attempt to call - ans mach LMTCB about rx'ing a lower costing PPI - on aciphex , [facility]. Need to know if pt wished this. KIK

## 2010-09-03 NOTE — Telephone Encounter (Signed)
Opened in error

## 2010-11-23 ENCOUNTER — Encounter: Payer: Self-pay | Admitting: Internal Medicine

## 2010-11-23 ENCOUNTER — Ambulatory Visit (INDEPENDENT_AMBULATORY_CARE_PROVIDER_SITE_OTHER): Payer: 59 | Admitting: Internal Medicine

## 2010-11-23 VITALS — BP 110/68 | HR 72 | Temp 97.8°F | Wt 187.0 lb

## 2010-11-23 DIAGNOSIS — R079 Chest pain, unspecified: Secondary | ICD-10-CM

## 2010-11-23 NOTE — Patient Instructions (Signed)
Call or return to clinic prn if these symptoms worsen or fail to improve as anticipated.

## 2010-11-23 NOTE — Progress Notes (Signed)
  Subjective:    Patient ID: Jose [name], male    DOB: Jan 02, 1961, 50 y.o.   MRN: [id number]  HPI  50 year old patient who is seen today with a chief complaint of chest pain. This has been a symptom for one month. He describes pain in the anterior chest area it is aggravated by twisting and movement. This usually occurs at the gym. He is exercising vigorously including weight training. Pain is aggravated by movement but not by aerobic activity. He exercises very vigorously with aerobic exercises such as a spinning class without any exertional chest pain    Review of Systems  Constitutional: Negative for fever, chills, appetite change and fatigue.  HENT: Negative for hearing loss, ear pain, congestion, sore throat, trouble swallowing, neck stiffness, dental problem, voice change and tinnitus.   Eyes: Negative for pain, discharge and visual disturbance.  Respiratory: Negative for cough, chest tightness, wheezing and stridor.   Cardiovascular: Positive for chest pain. Negative for palpitations and leg swelling.  Gastrointestinal: Negative for nausea, vomiting, abdominal pain, diarrhea, constipation, blood in stool and abdominal distention.  Genitourinary: Negative for urgency, hematuria, flank pain, discharge, difficulty urinating and genital sores.  Musculoskeletal: Negative for myalgias, back pain, joint swelling, arthralgias and gait problem.  Skin: Negative for rash.  Neurological: Negative for dizziness, syncope, speech difficulty, weakness, numbness and headaches.  Hematological: Negative for adenopathy. Does not bruise/bleed easily.  Psychiatric/Behavioral: Negative for behavioral problems and dysphoric mood. The patient is not nervous/anxious.        Objective:   Physical Exam  Constitutional: He is oriented to person, place, and time. He appears well-developed.  HENT:  Head: Normocephalic.  Right Ear: External ear normal.  Left Ear: External ear normal.  Eyes: Conjunctivae and  EOM are normal.  Neck: Normal range of motion.  Cardiovascular: Normal rate and normal heart sounds.   Pulmonary/Chest: Effort normal and breath sounds normal. No respiratory distress. He has no wheezes. He has no rales.       O2 saturation 98 No chest wall tenderness  Abdominal: Bowel sounds are normal.  Musculoskeletal: Normal range of motion. He exhibits no edema and no tenderness.  Neurological: He is alert and oriented to person, place, and time.  Psychiatric: He has a normal mood and affect. His behavior is normal.          Assessment & Plan:   Chest wall pain. This appears to be musculoligamentous. We'll clinically observe. He was instructed to spend more time with stretching and warm ups prior to his exercise regimen. He will call if unimproved

## 2010-12-07 ENCOUNTER — Ambulatory Visit: Payer: 59 | Admitting: Internal Medicine

## 2011-03-08 ENCOUNTER — Ambulatory Visit (INDEPENDENT_AMBULATORY_CARE_PROVIDER_SITE_OTHER): Payer: PRIVATE HEALTH INSURANCE | Admitting: Psychology

## 2011-03-08 DIAGNOSIS — F411 Generalized anxiety disorder: Secondary | ICD-10-CM

## 2011-03-16 ENCOUNTER — Ambulatory Visit (INDEPENDENT_AMBULATORY_CARE_PROVIDER_SITE_OTHER): Payer: No Typology Code available for payment source | Admitting: Psychology

## 2011-03-16 DIAGNOSIS — F411 Generalized anxiety disorder: Secondary | ICD-10-CM

## 2011-03-24 ENCOUNTER — Ambulatory Visit: Payer: PRIVATE HEALTH INSURANCE | Admitting: Psychology

## 2011-04-07 ENCOUNTER — Ambulatory Visit: Payer: PRIVATE HEALTH INSURANCE | Admitting: Psychology

## 2011-04-14 ENCOUNTER — Ambulatory Visit: Payer: PRIVATE HEALTH INSURANCE | Admitting: Psychology

## 2011-05-26 ENCOUNTER — Ambulatory Visit (INDEPENDENT_AMBULATORY_CARE_PROVIDER_SITE_OTHER): Payer: Managed Care, Other (non HMO) | Admitting: Internal Medicine

## 2011-05-26 ENCOUNTER — Encounter: Payer: Self-pay | Admitting: Internal Medicine

## 2011-05-26 VITALS — BP 100/68 | Temp 97.9°F | Wt 182.0 lb

## 2011-05-26 DIAGNOSIS — J309 Allergic rhinitis, unspecified: Secondary | ICD-10-CM

## 2011-05-26 DIAGNOSIS — K219 Gastro-esophageal reflux disease without esophagitis: Secondary | ICD-10-CM

## 2011-05-26 NOTE — Patient Instructions (Addendum)
NASONEX  2 sprays once daily  Alavert/allegra/zyrtec  Once daily   Use saline irrigation, warm  moist compresses and over-the-counter decongestants only as directed.  Call if there is no improvement in 5 to 7 days, or sooner if you develop increasing pain, fever, or any new symptoms.  Call or return to clinic prn if these symptoms worsen or fail to improve as anticipated.

## 2011-05-26 NOTE — Progress Notes (Signed)
  Subjective:    Patient ID: Jose Jose [name], male    DOB: 06-11-1960, 51 y.o.   MRN: [id number]  HPI  51 year old patient who has a history of seasonal allergic rhinitis. He states that he usually becomes symptomatic in late spring. Complaints today include a three-day history of head congestion postnasal drip cough and hoarseness. He has been using Mucinex D. No fever or purulent drainage no productive cough or wheezing    Review of Systems  Constitutional: Negative for fever, chills, appetite change and fatigue.  HENT: Positive for congestion, rhinorrhea and postnasal drip. Negative for hearing loss, ear pain, sore throat, trouble swallowing, neck stiffness, dental problem, voice change and tinnitus.   Eyes: Negative for pain, discharge and visual disturbance.  Respiratory: Positive for cough. Negative for chest tightness, wheezing and stridor.   Cardiovascular: Negative for chest pain, palpitations and leg swelling.  Gastrointestinal: Negative for nausea, vomiting, abdominal pain, diarrhea, constipation, blood in stool and abdominal distention.  Genitourinary: Negative for urgency, hematuria, flank pain, discharge, difficulty urinating and genital sores.  Musculoskeletal: Negative for myalgias, back pain, joint swelling, arthralgias and gait problem.  Skin: Negative for rash.  Neurological: Negative for dizziness, syncope, speech difficulty, weakness, numbness and headaches.  Hematological: Negative for adenopathy. Does not bruise/bleed easily.  Psychiatric/Behavioral: Negative for behavioral problems and dysphoric mood. The patient is not nervous/anxious.        Objective:   Physical Exam  Constitutional: He is oriented to person, place, and time. He appears well-developed.  HENT:  Head: Normocephalic.  Right Ear: External ear normal.  Left Ear: External ear normal.  Eyes: Conjunctivae and EOM are normal.  Neck: Normal range of motion.  Cardiovascular: Normal rate and normal Jose [name]  sounds.   Pulmonary/Chest: Breath sounds normal.  Abdominal: Bowel sounds are normal.  Musculoskeletal: Normal range of motion. He exhibits no edema and no tenderness.  Neurological: He is alert and oriented to person, place, and time.  Psychiatric: He has a normal mood and affect. His behavior is normal.          Assessment & Plan:   Flare of allergic rhinitis. We'll treat with Depo-Medrol 80. We'll also consider antihistamines and continue expectorants Return if unimproved

## 2011-07-06 ENCOUNTER — Emergency Department (HOSPITAL_COMMUNITY): Payer: Managed Care, Other (non HMO)

## 2011-07-06 ENCOUNTER — Encounter (HOSPITAL_COMMUNITY): Payer: Self-pay | Admitting: Emergency Medicine

## 2011-07-06 ENCOUNTER — Emergency Department (HOSPITAL_COMMUNITY)
Admission: EM | Admit: 2011-07-06 | Discharge: 2011-07-06 | Disposition: A | Discharge status: Home / Self Care | Payer: Managed Care, Other (non HMO) | Attending: Emergency Medicine | Admitting: Emergency Medicine

## 2011-07-06 DIAGNOSIS — K219 Gastro-esophageal reflux disease without esophagitis: Secondary | ICD-10-CM | POA: Insufficient documentation

## 2011-07-06 DIAGNOSIS — M25561 Pain in right knee: Secondary | ICD-10-CM

## 2011-07-06 DIAGNOSIS — M25469 Effusion, unspecified knee: Secondary | ICD-10-CM | POA: Insufficient documentation

## 2011-07-06 DIAGNOSIS — F341 Dysthymic disorder: Secondary | ICD-10-CM | POA: Insufficient documentation

## 2011-07-06 DIAGNOSIS — Z79899 Other long term (current) drug therapy: Secondary | ICD-10-CM | POA: Insufficient documentation

## 2011-07-06 DIAGNOSIS — M25569 Pain in unspecified knee: Secondary | ICD-10-CM | POA: Insufficient documentation

## 2011-07-06 MED ORDER — HYDROCODONE-ACETAMINOPHEN 5-325 MG PO TABS: 1.0000 | ORAL_TABLET | ORAL | Status: AC | PRN

## 2011-07-06 NOTE — ED Provider Notes (Signed)
History     CSN: [id number]  Arrival date & time 07/06/11  1436   First MD Initiated Contact with Patient 07/06/11 1511      Chief Complaint  Patient presents with  . Knee Pain    r/knee pain x 5 days    (Consider location/radiation/quality/duration/timing/severity/associated sxs/prior treatment) HPI Comments: Patient here with right knee pain and swelling after doing squats while working out 5 days ago - reports minimal pain with flexion and extension but reports pain with standing and ambulation - no sense of joint instability - reports no prior history of injury to the knee - denies numbness, tingling or weakness distal to the injury.  Patient is a 51 y.o. male presenting with knee pain. The history is provided by the patient. No language interpreter was used.  Knee Pain This is a new problem. The current episode started in the past 7 days. The problem occurs constantly. The problem has been unchanged. Associated symptoms include arthralgias and joint swelling. Pertinent negatives include no abdominal pain, anorexia, change in bowel habit, chest pain, chills, congestion, coughing, diaphoresis, fatigue, fever, headaches, myalgias, nausea, neck pain, numbness, rash, sore throat, swollen glands, urinary symptoms, vertigo, visual change, vomiting or weakness. The symptoms are aggravated by bending. He has tried nothing for the symptoms. The treatment provided no relief.    Past Medical History  Diagnosis Date  . 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 ANXIETY DEPRESSION 03/09/2009  . ARTHRALGIA 11/13/2008  . DEPRESSION 03/09/2009  . Dermatophytosis of groin and perianal area 05/16/2007  . GERD 09/13/2008  . HEMATURIA, HX OF 05/28/2009  . History of ETOH abuse     History reviewed. No pertinent past surgical history.  Family History  Problem Relation Age of Onset  . Diabetes Mother   . Hypertension Mother   . Cancer Mother     History  Substance Use Topics  . Smoking  status: Former Smoker    Quit date: 01/04/1995  . Smokeless tobacco: Former Neurosurgeon  . Alcohol Use: No      Review of Systems  Constitutional: Negative for fever, chills, diaphoresis and fatigue.  HENT: Negative for congestion, sore throat and neck pain.   Respiratory: Negative for cough.   Cardiovascular: Negative for chest pain.  Gastrointestinal: Negative for nausea, vomiting, abdominal pain, anorexia and change in bowel habit.  Musculoskeletal: Positive for joint swelling and arthralgias. Negative for myalgias.  Skin: Negative for rash.  Neurological: Negative for vertigo, weakness, numbness and headaches.  All other systems reviewed and are negative.    Allergies  Review of patient's allergies indicates no known allergies.  Home Medications   Current Outpatient Rx  Name Route Sig Dispense Refill  . ENABLEX PO Oral Take 1 tablet by mouth daily. Pt gets samples from MD's  Office. Dosage unknown.    Marland Kitchen GRAPE SEED EXTRACT PO Oral Take 1 tablet by mouth daily.    . IBUPROFEN 200 MG PO TABS Oral Take 400 mg by mouth every 6 (six) hours as needed. pain    . ADULT MULTIVITAMIN W/MINERALS CH Oral Take 1 tablet by mouth daily.    Marland Kitchen NAPROXEN 500 MG PO TABS Oral Take 500 mg by mouth 2 (two) times daily with a meal. pain    . RABEPRAZOLE SODIUM 20 MG PO TBEC Oral Take 1 tablet (20 mg total) by mouth daily. 30 tablet 1    BP 112/85  Pulse 71  Temp 98.1 F (36.7 C) (Oral)  Resp 20  Ht  5\' 10"  (1.778 m)  Wt 185 lb 3.2 oz (84.006 kg)  BMI 26.57 kg/m2  SpO2 97%  Physical Exam  Nursing note and vitals reviewed. Constitutional: He is oriented to person, place, and time. He appears well-developed and well-nourished. No distress.  HENT:  Head: Normocephalic and atraumatic.  Right Ear: External ear normal.  Left Ear: External ear normal.  Nose: Nose normal.  Mouth/Throat: Oropharynx is clear and moist. No oropharyngeal exudate.  Eyes: Conjunctivae are normal. Pupils are equal, round,  and reactive to light. No scleral icterus.  Neck: Normal range of motion. Neck supple.  Cardiovascular: Normal rate, regular rhythm and normal heart sounds.  Exam reveals no gallop and no friction rub.   No murmur heard. Pulmonary/Chest: Breath sounds normal. No respiratory distress. He has no wheezes. He has no rales. He exhibits no tenderness.  Abdominal: Soft. Bowel sounds are normal. He exhibits no distension. There is no tenderness.  Musculoskeletal:       Right knee: He exhibits effusion. He exhibits normal range of motion, no swelling, no ecchymosis, no deformity, normal alignment, no LCL laxity, normal meniscus and no MCL laxity. tenderness found. Medial joint line and lateral joint line tenderness noted.       No pain with flexion and extension - pain to palpation of medial and lateral joint line.  Lymphadenopathy:    He has no cervical adenopathy.  Neurological: He is alert and oriented to person, place, and time. No cranial nerve deficit. He exhibits normal muscle tone. Coordination normal.  Skin: Skin is warm and dry. No rash noted. No erythema. No pallor.  Psychiatric: He has a normal mood and affect. His behavior is normal. Judgment and thought content normal.    ED Course  Procedures (including critical care time)  Labs Reviewed - No data to display Dg Knee Complete 4 Views Right  07/06/2011  *RADIOLOGY REPORT*  Clinical Data: Right knee pain/swelling  RIGHT KNEE - COMPLETE 4+ VIEW  Comparison: None.  Findings: No fracture or dislocation is seen.  The joint spaces are essentially preserved.  Small suprapatellar knee joint effusion.  IMPRESSION: No fracture or dislocation is seen.  Small suprapatellar knee joint effusion.  Original Report Authenticated By: Charline Bills, M.D.     Right knee pain   MDM  Patient here with right knee pain and small effusion s/p squats, there was no evidence of laxity of ligaments, plan to place in knee immobilizer and he will follow up with  Dr. Luiz Blare with ortho.        Izola Price [place], [place] 07/06/11 [phone]

## 2011-07-06 NOTE — ED Notes (Signed)
Pt reports 5 day hx of r/knee pain after exercise

## 2011-07-06 NOTE — ED Provider Notes (Signed)
Medical screening examination/treatment/procedure(s) were performed by non-physician practitioner and as supervising physician I was immediately available for consultation/collaboration.    Vida Roller, MD 07/06/11 [phone]

## 2011-08-20 ENCOUNTER — Emergency Department (HOSPITAL_COMMUNITY): Payer: Self-pay

## 2011-08-20 ENCOUNTER — Emergency Department (HOSPITAL_COMMUNITY)
Admission: EM | Admit: 2011-08-20 | Discharge: 2011-08-20 | Disposition: A | Discharge status: Home / Self Care | Payer: Self-pay | Attending: Emergency Medicine | Admitting: Emergency Medicine

## 2011-08-20 ENCOUNTER — Encounter (HOSPITAL_COMMUNITY): Payer: Self-pay | Admitting: Emergency Medicine

## 2011-08-20 DIAGNOSIS — F3289 Other specified depressive episodes: Secondary | ICD-10-CM | POA: Insufficient documentation

## 2011-08-20 DIAGNOSIS — J45909 Unspecified asthma, uncomplicated: Secondary | ICD-10-CM | POA: Insufficient documentation

## 2011-08-20 DIAGNOSIS — R0602 Shortness of breath: Secondary | ICD-10-CM | POA: Insufficient documentation

## 2011-08-20 DIAGNOSIS — R079 Chest pain, unspecified: Secondary | ICD-10-CM | POA: Insufficient documentation

## 2011-08-20 DIAGNOSIS — F17200 Nicotine dependence, unspecified, uncomplicated: Secondary | ICD-10-CM | POA: Insufficient documentation

## 2011-08-20 DIAGNOSIS — F329 Major depressive disorder, single episode, unspecified: Secondary | ICD-10-CM | POA: Insufficient documentation

## 2011-08-20 HISTORY — DX: Unspecified asthma, uncomplicated: J45.909

## 2011-08-20 LAB — BASIC METABOLIC PANEL
BUN: 8 mg/dL (ref 6–23)
CO2: 29 mEq/L (ref 19–32)
Chloride: 99 mEq/L (ref 96–112)
Creatinine, Ser: 1.01 mg/dL (ref 0.50–1.35)
GFR calc Af Amer: >90 mL/min (ref >90)
Potassium: 4.1 mEq/L (ref 3.5–5.1)

## 2011-08-20 LAB — CBC WITH DIFFERENTIAL/PLATELET
Basophils Absolute: 0 10*3/uL (ref 0.0–0.1)
Eosinophils Relative: 2 % (ref 0–5)
HCT: 45.6 % (ref 39.0–52.0)
Hemoglobin: 15.5 g/dL (ref 13.0–17.0)
Lymphocytes Relative: 14 % (ref 12–46)
MCHC: 34 g/dL (ref 30.0–36.0)
MCV: 90.5 fL (ref 78.0–100.0)
Monocytes Absolute: 0.8 10*3/uL (ref 0.1–1.0)
Monocytes Relative: 10 % (ref 3–12)
Neutro Abs: 6.2 10*3/uL (ref 1.7–7.7)
RDW: 13.1 % (ref 11.5–15.5)

## 2011-08-20 MED ORDER — IPRATROPIUM BROMIDE 0.02 % IN SOLN
0.5000 mg | Freq: Once | RESPIRATORY_TRACT | Status: AC
  Administered 2011-08-20: 0.5 mg via RESPIRATORY_TRACT
  Filled 2011-08-20: qty 2.5

## 2011-08-20 MED ORDER — IOHEXOL 350 MG/ML SOLN
100.0000 mL | Freq: Once | INTRAVENOUS | Status: AC | PRN
  Administered 2011-08-20: 100 mL via INTRAVENOUS

## 2011-08-20 MED ORDER — SODIUM CHLORIDE 0.9 % IV SOLN
Freq: Once | INTRAVENOUS | Status: AC
  Administered 2011-08-20: 20:00:00 via INTRAVENOUS

## 2011-08-20 MED ORDER — ASPIRIN 81 MG PO CHEW
162.0000 mg | CHEWABLE_TABLET | Freq: Once | ORAL | Status: AC
  Administered 2011-08-20: 162 mg via ORAL

## 2011-08-20 MED ORDER — ASPIRIN 81 MG PO CHEW
162.0000 mg | CHEWABLE_TABLET | Freq: Once | ORAL | Status: AC
  Administered 2011-08-20: 162 mg via ORAL
  Filled 2011-08-20: qty 4

## 2011-08-20 MED ORDER — NITROGLYCERIN 0.4 MG SL SUBL
0.4000 mg | SUBLINGUAL_TABLET | SUBLINGUAL | Status: DC | PRN
  Administered 2011-08-20: 0.4 mg via SUBLINGUAL
  Filled 2011-08-20: qty 25

## 2011-08-20 MED ORDER — ALBUTEROL SULFATE (5 MG/ML) 0.5% IN NEBU
5.0000 mg | INHALATION_SOLUTION | Freq: Once | RESPIRATORY_TRACT | Status: AC
  Administered 2011-08-20: 5 mg via RESPIRATORY_TRACT
  Filled 2011-08-20: qty 1

## 2011-08-20 NOTE — ED Notes (Signed)
Patient denies pain and is resting comfortably.  

## 2011-08-20 NOTE — ED Notes (Signed)
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 Denies chest pain now. Does not have inhaler

## 2011-08-20 NOTE — ED Notes (Signed)
MD at bedside. 

## 2011-08-20 NOTE — ED Notes (Signed)
EKG to [facility]

## 2011-08-20 NOTE — ED Notes (Signed)
Pt getting breathing treatment

## 2011-08-20 NOTE — ED Notes (Signed)
Woke with headache and runny nose as well

## 2011-08-21 NOTE — ED Provider Notes (Signed)
History     CSN: [id number]  Arrival date & time 08/20/11  3086   First MD Initiated Contact with Patient 08/20/11 1919      Chief Complaint  Patient presents with  . Respiratory Distress    (Consider location/radiation/quality/duration/timing/severity/associated sxs/prior treatment) HPI Comments: 51 y/o male comes in with cc o fchest pain, sob. Pt states that he was cleaning at work, and started having some SOB. The SOB is descrbied as difficulty getting "air in." There is no associated wheezing, but patient does have some cough. Pt's pain is pleuritic in nature ,and is diffuse. No exertional component, and not worse with palpation. No hx of PE, DVT. No known cardiac hx, and patient has about 7 pack yr smoking hx. Denies any illicit use.  The history is provided by the patient.    Past Medical History  Diagnosis Date  . 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 ANXIETY DEPRESSION 03/09/2009  . ARTHRALGIA 11/13/2008  . DEPRESSION 03/09/2009  . Dermatophytosis of groin and perianal area 05/16/2007  . GERD 09/13/2008  . HEMATURIA, HX OF 05/28/2009  . History of ETOH abuse   . Asthma     History reviewed. No pertinent past surgical history.  Family History  Problem Relation Age of Onset  . Diabetes Mother   . Hypertension Mother   . Cancer Mother     History  Substance Use Topics  . Smoking status: Current Some Day Smoker -- 0.5 packs/day    Last Attempt to Quit: 01/04/1995  . Smokeless tobacco: Former Neurosurgeon  . Alcohol Use: No      Review of Systems  Constitutional: Negative for activity change and appetite change.  Respiratory: Positive for cough and shortness of breath. Negative for wheezing.   Cardiovascular: Positive for chest pain. Negative for leg swelling.  Gastrointestinal: Negative for abdominal pain.  Genitourinary: Negative for dysuria.    Allergies  Review of patient's allergies indicates no known allergies.  Home Medications   Current  Outpatient Rx  Name Route Sig Dispense Refill  . DM-GUAIFENESIN ER 30-600 MG PO TB12 Oral Take 1 tablet by mouth once.    Marland Kitchen GRAPE SEED EXTRACT PO Oral Take 1 tablet by mouth daily.    Marland Kitchen HYDROCODONE-ACETAMINOPHEN 7.5-500 MG PO TABS Oral Take 1 tablet by mouth every 6 (six) hours as needed. pain    . IBUPROFEN 200 MG PO TABS Oral Take 400 mg by mouth every 6 (six) hours as needed. pain    . MELOXICAM 7.5 MG PO TABS Oral Take 7.5 mg by mouth daily.    . ADULT MULTIVITAMIN W/MINERALS CH Oral Take 1 tablet by mouth daily.      BP 112/92  Pulse 94  Temp 98.1 F (36.7 C) (Oral)  Resp 18  SpO2 96%  Physical Exam  Constitutional: He is oriented to person, place, and time. He appears well-developed.  HENT:  Head: Normocephalic and atraumatic.  Eyes: Conjunctivae and EOM are normal. Pupils are equal, round, and reactive to light.  Neck: Normal range of motion. Neck supple.  Cardiovascular: Normal rate and regular rhythm.   Pulmonary/Chest: Effort normal and breath sounds normal. No respiratory distress. He has no wheezes. He has no rales. He exhibits no tenderness.  Abdominal: Soft. Bowel sounds are normal. He exhibits no distension. There is no tenderness. There is no rebound and no guarding.  Musculoskeletal: He exhibits no edema and no tenderness.  Neurological: He is alert and oriented to person, place, and time.  Skin:  Skin is warm.    ED Course  Procedures (including critical care time)  Labs Reviewed  BASIC METABOLIC PANEL - Abnormal; Notable for the following:    GFR calc non Af Amer 84 (*)     All other components within normal limits  CBC WITH DIFFERENTIAL  TROPONIN I  D-DIMER, QUANTITATIVE   Dg Chest 2 View  08/20/2011  *RADIOLOGY REPORT*  Clinical Data: Respiratory distress, chest pain.  CHEST - 2 VIEW  Comparison: None.  Findings: Cardiomediastinal silhouette appears normal.  No acute pulmonary disease is noted.  Bony thorax is intact.  IMPRESSION: No acute  cardiopulmonary abnormality seen.  Original Report Authenticated By: Venita Sheffield., M.D.   Ct Angio Chest W/cm &/or Wo Cm  08/20/2011  *RADIOLOGY REPORT*  Clinical Data: Chest pain, tachycardia.  CT ANGIOGRAPHY CHEST  Technique:  Multidetector CT imaging of the chest using the standard protocol during bolus administration of intravenous contrast. Multiplanar reconstructed images including MIPs were obtained and reviewed to evaluate the vascular anatomy.  Contrast: OMNIPAQUE IOHEXOL 350 MG/ML SOLN  Comparison: None.  Findings: No filling defects in the pulmonary arteries to suggest pulmonary emboli.  Bibasilar opacities, likely atelectasis.  No pleural effusions.  Lungs otherwise clear.  Heart is normal size. Aorta is normal caliber. No mediastinal, hilar, or axillary adenopathy.  Visualized thyroid and chest wall soft tissues unremarkable. Imaging into the upper abdomen shows no acute findings.  No acute bony abnormality.  IMPRESSION: Bibasilar opacities, likely atelectasis.  No evidence of pulmonary embolus.  Original Report Authenticated By: Cyndie Chime, M.D.     1. Shortness of breath       MDM   Date: 08/21/2011  Rate: 121  Rhythm: sinus tachycardia  QRS Axis: normal  Intervals: normal  ST/T Wave abnormalities: normal  Conduction Disutrbances:none  Narrative Interpretation:   Old EKG Reviewed: unchanged  Differential diagnosis includes: ACS syndrome CHF exacerbation Valvular disorder Myocarditis Pericarditis Pericardial effusion Pneumonia Pleural effusion Pulmonary edema PE Anemia Musculoskeletal pain  Pt with no significant cardiac hx, or risk factors besides smoking and age comes in with cc of sob and some pleuritic chest pain. WELLs score for PE is 1.5 - for tachycardia - and so we will get dimer to r/o PE. Pt is chest pain free, and in no distress at my evaluation. Pt doesn't think the sx are consistent with anxiety.  Late entry: Dimer is right at  borderline for elevation - .48. Given patient's hx and clinical presentation, we will get CT PE as the pretest probability with a borderline dimer necessisate PE rule out.           Derwood Kaplan, MD 08/21/11 [phone]

## 2011-08-23 ENCOUNTER — Telehealth: Payer: Self-pay | Admitting: Internal Medicine

## 2011-08-23 NOTE — Telephone Encounter (Signed)
Caller: [name]/Patient; Patient Name: Jose [name]; PCP: Eleonore Chiquito; Best Callback Phone Number: [phone]; Reason for call: Breathing Difficulties-THE PATIENT REFUSED 911 Patient calling for a followup appointment after being seen in the ER on 08/20/11 for shortness of breath and chest pain. Echo, CAT scan and blood work done-All wnl per patient except BP (135/85). Released without any prescriptions, only a recommendation to f/u with PCP. Patient denies chest pain now (8/20 at 0920) but still having shortness of breath with ambulation. Shortness of breath started 2 weeks ago after patient started smoking again. Emergent symptoms of Breathing Problems Protocol ruled out. See within 4 hours for: New or worsening breathing problems not responding to treatment or no treatment plan. No appointments available on Dr. Charm Rings schedule for today, 08/23/11. Sending a note for work in appointment per protocol. Please call patient back at the number above for work in appointment today, 08/23/11. Care advice given with strict call back parameters.

## 2011-08-23 NOTE — Telephone Encounter (Signed)
Spoke with pt - stable - will see tomorrwo at 11am - if sx worse - to Er tonight

## 2011-08-24 ENCOUNTER — Encounter: Payer: Self-pay | Admitting: Internal Medicine

## 2011-08-24 ENCOUNTER — Ambulatory Visit (INDEPENDENT_AMBULATORY_CARE_PROVIDER_SITE_OTHER): Payer: Managed Care, Other (non HMO) | Admitting: Internal Medicine

## 2011-08-24 VITALS — BP 110/74 | Temp 98.1°F | Wt 178.0 lb

## 2011-08-24 DIAGNOSIS — J069 Acute upper respiratory infection, unspecified: Secondary | ICD-10-CM

## 2011-08-24 MED ORDER — LEVALBUTEROL TARTRATE 45 MCG/ACT IN AERO: 1.0000 | INHALATION_SPRAY | RESPIRATORY_TRACT | Status: DC | PRN

## 2011-08-24 NOTE — Progress Notes (Signed)
  Subjective:    Patient ID: Jose [name], male    DOB: [birth year], 51 y.o.   MRN: [id number]  HPI  51 year old patient who is seen today for followup. He was evaluated in the ED recently due 2 wheezing. He states that he resumed smoking approximately 2 weeks ago and developed URI symptoms. ED evaluation was fairly complete that included a CTA to exclude a pulmonary embolism. Chest x-ray was unremarkable. He has cut his tobacco consumption considerably and today feels well. There has been no recurrent wheezing. He states that he is scheduled for elective knee surgery in October.    Review of Systems  Constitutional: Negative for fever, chills, appetite change and fatigue.  HENT: Negative for hearing loss, ear pain, congestion, sore throat, trouble swallowing, neck stiffness, dental problem, voice change and tinnitus.   Eyes: Negative for pain, discharge and visual disturbance.  Respiratory: Positive for shortness of breath. Negative for cough, chest tightness, wheezing and stridor.   Cardiovascular: Negative for chest pain, palpitations and leg swelling.  Gastrointestinal: Negative for nausea, vomiting, abdominal pain, diarrhea, constipation, blood in stool and abdominal distention.  Genitourinary: Negative for urgency, hematuria, flank pain, discharge, difficulty urinating and genital sores.  Musculoskeletal: Negative for myalgias, back pain, joint swelling, arthralgias and gait problem.  Skin: Negative for rash.  Neurological: Negative for dizziness, syncope, speech difficulty, weakness, numbness and headaches.  Hematological: Negative for adenopathy. Does not bruise/bleed easily.  Psychiatric/Behavioral: Negative for behavioral problems and dysphoric mood. The patient is not nervous/anxious.        Objective:   Physical Exam  Constitutional: He is oriented to person, place, and time. He appears well-developed.  HENT:  Head: Normocephalic.  Right Ear: External ear normal.  Left Ear:  External ear normal.  Eyes: Conjunctivae and EOM are normal.  Neck: Normal range of motion.  Cardiovascular: Normal rate and normal heart sounds.   Pulmonary/Chest: Breath sounds normal.  Abdominal: Bowel sounds are normal.  Musculoskeletal: Normal range of motion. He exhibits no edema and no tenderness.  Neurological: He is alert and oriented to person, place, and time.  Psychiatric: He has a normal mood and affect. His behavior is normal.          Assessment & Plan:   Viral URI. We'll treat symptomatically Tobacco use. Total cessation encouraged  CPX as scheduled

## 2011-08-24 NOTE — Patient Instructions (Signed)
Get plenty of rest, Drink lots of  clear liquids, and use Tylenol or ibuprofen for fever and discomfort.    Smoking tobacco is very bad for your health. You should stop smoking immediately. 

## 2011-09-08 ENCOUNTER — Other Ambulatory Visit (INDEPENDENT_AMBULATORY_CARE_PROVIDER_SITE_OTHER): Payer: Managed Care, Other (non HMO)

## 2011-09-08 DIAGNOSIS — Z0000 Encounter for general adult medical examination without abnormal findings: Secondary | ICD-10-CM

## 2011-09-08 DIAGNOSIS — Z13220 Encounter for screening for lipoid disorders: Secondary | ICD-10-CM

## 2011-09-08 LAB — BASIC METABOLIC PANEL
BUN: 12 mg/dL (ref 6–23)
CO2: 27 mEq/L (ref 19–32)
Glucose, Bld: 79 mg/dL (ref 70–99)
Potassium: 3.9 mEq/L (ref 3.5–5.1)
Sodium: 140 mEq/L (ref 135–145)

## 2011-09-08 LAB — CBC WITH DIFFERENTIAL/PLATELET
Basophils Absolute: 0.1 10*3/uL (ref 0.0–0.1)
Eosinophils Absolute: 0.5 10*3/uL (ref 0.0–0.7)
HCT: 44.5 % (ref 39.0–52.0)
Hemoglobin: 14.5 g/dL (ref 13.0–17.0)
Lymphs Abs: 2.1 10*3/uL (ref 0.7–4.0)
MCHC: 32.7 g/dL (ref 30.0–36.0)
MCV: 92.8 fl (ref 78.0–100.0)
Monocytes Absolute: 0.7 10*3/uL (ref 0.1–1.0)
Neutro Abs: 2.6 10*3/uL (ref 1.4–7.7)
Platelets: 160 10*3/uL (ref 150.0–400.0)
RDW: 13.4 % (ref 11.5–14.6)

## 2011-09-08 LAB — PSA: PSA: 6.49 ng/mL — ABNORMAL HIGH (ref 0.10–4.00)

## 2011-09-08 LAB — LIPID PANEL
Cholesterol: 223 mg/dL — ABNORMAL HIGH (ref 0–200)
Total CHOL/HDL Ratio: 5

## 2011-09-08 LAB — HEPATIC FUNCTION PANEL
AST: 24 U/L (ref 0–37)
Albumin: 3.7 g/dL (ref 3.5–5.2)
Total Bilirubin: 0.5 mg/dL (ref 0.3–1.2)

## 2011-09-08 LAB — TSH: TSH: 2.39 u[IU]/mL (ref 0.35–5.50)

## 2011-09-08 LAB — POCT URINALYSIS DIPSTICK
Nitrite, UA: NEGATIVE
Protein, UA: NEGATIVE
Spec Grav, UA: >=1.03
Urobilinogen, UA: 0.2

## 2011-09-08 NOTE — Addendum Note (Signed)
Addended by: Bonnye Fava on: 09/08/2011 08:43 AM   Modules accepted: Orders

## 2011-09-09 NOTE — Progress Notes (Signed)
Quick Note:  Spoke with pt- informed labs normal and neg. KIK ______

## 2011-09-20 ENCOUNTER — Other Ambulatory Visit: Payer: Self-pay | Admitting: Orthopedic Surgery

## 2011-09-21 ENCOUNTER — Ambulatory Visit (INDEPENDENT_AMBULATORY_CARE_PROVIDER_SITE_OTHER): Payer: No Typology Code available for payment source | Admitting: Internal Medicine

## 2011-09-21 ENCOUNTER — Encounter: Payer: Self-pay | Admitting: Internal Medicine

## 2011-09-21 VITALS — BP 110/82 | HR 70 | Temp 98.1°F | Resp 18 | Ht 69.5 in | Wt 183.0 lb

## 2011-09-21 DIAGNOSIS — C61 Malignant neoplasm of prostate: Secondary | ICD-10-CM

## 2011-09-21 DIAGNOSIS — Z23 Encounter for immunization: Secondary | ICD-10-CM

## 2011-09-21 DIAGNOSIS — Z0000 Encounter for general adult medical examination without abnormal findings: Secondary | ICD-10-CM

## 2011-09-21 NOTE — Patient Instructions (Signed)
Followup urology  Return here in one year for followup  Smoking tobacco is very bad for your health. You should stop smoking immediately.

## 2011-09-21 NOTE — Progress Notes (Signed)
Subjective:    Patient ID: Jose [name], male    DOB: [birth year], 51 y.o.   MRN: [id number]  HPI  51 year old patient who is seen today for a preventive health examination.  He is followed closely by urology due to localized prostate cancer. He had a prostate biopsy approximately one year ago that revealed a positive focus but states that he has had a followup biopsy that was negative. Recent PSA was 6.49. He was evaluated last month at the ED due 2 asthmatic bronchitis. He does have a meter dose inhaler. Unfortunately continues to smoke about one half pack of cigarettes per day   Past Medical History  Diagnosis Date  . 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 ANXIETY DEPRESSION 03/09/2009  . ARTHRALGIA 11/13/2008  . DEPRESSION 03/09/2009  . Dermatophytosis of groin and perianal area 05/16/2007  . GERD 09/13/2008  . HEMATURIA, HX OF 05/28/2009  . History of ETOH abuse   . Asthma     History   Social History  . Marital Status: Married    Spouse Name: N/A    Number of Children: N/A  . Years of Education: N/A   Occupational History  . Not on file.   Social History Main Topics  . Smoking status: Current Some Day Smoker -- 0.2 packs/day    Types: Cigarettes  . Smokeless tobacco: Former Neurosurgeon  . Alcohol Use: No  . Drug Use: No  . Sexually Active: Not on file   Other Topics Concern  . Not on file   Social History Narrative  . No narrative on file    No past surgical history on file.  Family History  Problem Relation Age of Onset  . Diabetes Mother   . Hypertension Mother   . Cancer Mother     No Known Allergies  Current Outpatient Prescriptions on File Prior to Visit  Medication Sig Dispense Refill  . dextromethorphan-guaiFENesin (MUCINEX DM) 30-600 MG per 12 hr tablet Take 1 tablet by mouth once.      Marland Kitchen GRAPE SEED EXTRACT PO Take 1 tablet by mouth daily.      Marland Kitchen HYDROcodone-acetaminophen (LORTAB) 7.5-500 MG per tablet Take 1 tablet by mouth every 6 (six)  hours as needed. pain      . ibuprofen (ADVIL,MOTRIN) 200 MG tablet Take 400 mg by mouth every 6 (six) hours as needed. pain      . levalbuterol (XOPENEX HFA) 45 MCG/ACT inhaler Inhale 1-2 puffs into the lungs every 4 (four) hours as needed for wheezing.  1 Inhaler  12  . meloxicam (MOBIC) 7.5 MG tablet Take 7.5 mg by mouth daily.      . Multiple Vitamin (MULTIVITAMIN WITH MINERALS) TABS Take 1 tablet by mouth daily.        BP 110/82  Pulse 70  Temp 98.1 F (36.7 C) (Oral)  Resp 18  Ht 5' 9.5" (1.765 m)  Wt 183 lb (83.008 kg)  BMI 26.64 kg/m2  SpO2 96%       Review of Systems  Constitutional: Negative for fever, chills, activity change, appetite change and fatigue.  HENT: Negative for hearing loss, ear pain, congestion, rhinorrhea, sneezing, mouth sores, trouble swallowing, neck pain, neck stiffness, dental problem, voice change, sinus pressure and tinnitus.   Eyes: Negative for photophobia, pain, redness and visual disturbance.  Respiratory: Negative for apnea, cough, choking, chest tightness, shortness of breath and wheezing.   Cardiovascular: Negative for chest pain, palpitations and leg swelling.  Gastrointestinal: Negative for nausea, vomiting, abdominal  pain, diarrhea, constipation, blood in stool, abdominal distention, anal bleeding and rectal pain.  Genitourinary: Negative for dysuria, urgency, frequency, hematuria, flank pain, decreased urine volume, discharge, penile swelling, scrotal swelling, difficulty urinating, genital sores and testicular pain.  Musculoskeletal: Negative for myalgias, back pain, joint swelling, arthralgias and gait problem.  Skin: Negative for color change, rash and wound.  Neurological: Negative for dizziness, tremors, seizures, syncope, facial asymmetry, speech difficulty, weakness, light-headedness, numbness and headaches.  Hematological: Negative for adenopathy. Does not bruise/bleed easily.  Psychiatric/Behavioral: Negative for suicidal ideas,  hallucinations, behavioral problems, confusion, disturbed wake/sleep cycle, self-injury, dysphoric mood, decreased concentration and agitation. The patient is not nervous/anxious.        Objective:   Physical Exam  Constitutional: He appears well-developed and well-nourished.  HENT:  Head: Normocephalic and atraumatic.  Right Ear: External ear normal.  Left Ear: External ear normal.  Nose: Nose normal.  Mouth/Throat: Oropharynx is clear and moist.  Eyes: Conjunctivae normal and EOM are normal. Pupils are equal, round, and reactive to light. No scleral icterus.  Neck: Normal range of motion. Neck supple. No JVD present. No thyromegaly present.  Cardiovascular: Regular rhythm, normal heart sounds and intact distal pulses.  Exam reveals no gallop and no friction rub.   No murmur heard.      Slightly decreased left dorsalis pedis pulse  Pulmonary/Chest: Effort normal and breath sounds normal. He exhibits no tenderness.  Abdominal: Soft. Bowel sounds are normal. He exhibits no distension and no mass. There is no tenderness.  Genitourinary: Penis normal. No penile tenderness.  Musculoskeletal: Normal range of motion. He exhibits no edema and no tenderness.  Lymphadenopathy:    He has no cervical adenopathy.  Neurological: He is alert. He has normal reflexes. No cranial nerve deficit. Coordination normal.  Skin: Skin is warm and dry. No rash noted.  Psychiatric: He has a normal mood and affect. His behavior is normal.          Assessment & Plan:   Preventive health examination History prostate cancer. Followup urology. We'll fax a copy of the lab and the patient also given a copy Tobacco use. Total cessation encouraged  Recheck 1 year

## 2011-10-10 ENCOUNTER — Encounter (HOSPITAL_BASED_OUTPATIENT_CLINIC_OR_DEPARTMENT_OTHER): Admission: RE | Payer: Self-pay | Source: Ambulatory Visit

## 2011-10-10 ENCOUNTER — Ambulatory Visit (HOSPITAL_BASED_OUTPATIENT_CLINIC_OR_DEPARTMENT_OTHER): Admission: RE | Admit: 2011-10-10 | Payer: Self-pay | Source: Ambulatory Visit | Admitting: Orthopedic Surgery

## 2011-10-10 SURGERY — ARTHROSCOPY, KNEE, WITH MEDIAL MENISCECTOMY
Anesthesia: Choice | Laterality: Right

## 2011-11-07 ENCOUNTER — Other Ambulatory Visit: Payer: Self-pay | Admitting: Internal Medicine

## 2011-11-07 NOTE — Telephone Encounter (Signed)
30

## 2011-11-07 NOTE — Telephone Encounter (Signed)
Last seen 09-21-11  cpx Never written for Lortab in epic past or present - was given norco 5-325 by orth? PA  Please advise Knee arthroscopy cx'd in epic

## 2011-11-07 NOTE — Telephone Encounter (Signed)
Called in.

## 2012-01-18 ENCOUNTER — Ambulatory Visit (INDEPENDENT_AMBULATORY_CARE_PROVIDER_SITE_OTHER): Payer: No Typology Code available for payment source | Admitting: Internal Medicine

## 2012-01-18 ENCOUNTER — Encounter: Payer: Self-pay | Admitting: Internal Medicine

## 2012-01-18 VITALS — BP 110/78 | HR 77 | Temp 98.8°F | Resp 18 | Wt 188.0 lb

## 2012-01-18 DIAGNOSIS — R300 Dysuria: Secondary | ICD-10-CM

## 2012-01-18 DIAGNOSIS — C61 Malignant neoplasm of prostate: Secondary | ICD-10-CM

## 2012-01-18 LAB — POCT URINALYSIS DIPSTICK
Glucose, UA: NEGATIVE
Ketones, UA: NEGATIVE
Leukocytes, UA: NEGATIVE
Urobilinogen, UA: 0.2

## 2012-01-18 MED ORDER — MELOXICAM 7.5 MG PO TABS: 7.5000 mg | ORAL_TABLET | Freq: Every day | ORAL | Status: DC

## 2012-01-18 MED ORDER — DOXYCYCLINE HYCLATE 100 MG PO TABS: 100.0000 mg | ORAL_TABLET | Freq: Two times a day (BID) | ORAL | Status: DC

## 2012-01-18 NOTE — Progress Notes (Signed)
  Subjective:    Patient ID: Jose [name], male    DOB: 26-Feb-1960, 52 y.o.   MRN: [id number]  HPI  52 year old patient who has a history of prostate cancer and is followed closely by urology. He has had a recent urological evaluation. In the past several days she has had some burning dysuria and urgency. There's been no pyuria or hematuria. He also describes some low back pain and some mild discomfort in the perineal area that has resolved. No fever or chills. No history of prior UTIs or prostate infections.  Past Medical History  Diagnosis Date  . 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 ANXIETY DEPRESSION 03/09/2009  . ARTHRALGIA 11/13/2008  . DEPRESSION 03/09/2009  . Dermatophytosis of groin and perianal area 05/16/2007  . GERD 09/13/2008  . HEMATURIA, HX OF 05/28/2009  . History of ETOH abuse   . Asthma     History   Social History  . Marital Status: Married    Spouse Name: N/A    Number of Children: N/A  . Years of Education: N/A   Occupational History  . Not on file.   Social History Main Topics  . Smoking status: Current Some Day Smoker -- 0.2 packs/day    Types: Cigarettes  . Smokeless tobacco: Former Neurosurgeon  . Alcohol Use: No  . Drug Use: No  . Sexually Active: Not on file   Other Topics Concern  . Not on file   Social History Narrative  . No narrative on file    No past surgical history on file.  Family History  Problem Relation Age of Onset  . Diabetes Mother   . Hypertension Mother   . Cancer Mother     No Known Allergies  Current Outpatient Prescriptions on File Prior to Visit  Medication Sig Dispense Refill  . dextromethorphan-guaiFENesin (MUCINEX DM) 30-600 MG per 12 hr tablet Take 1 tablet by mouth once.      Marland Kitchen GRAPE SEED EXTRACT PO Take 1 tablet by mouth daily.      Marland Kitchen HYDROcodone-acetaminophen (VICODIN ES) 7.5-750 MG per tablet Take 1 tablet by mouth every 6 (six) hours as needed for pain.  30 tablet  0  . ibuprofen (ADVIL,MOTRIN) 200  MG tablet Take 400 mg by mouth every 6 (six) hours as needed. pain      . levalbuterol (XOPENEX HFA) 45 MCG/ACT inhaler Inhale 1-2 puffs into the lungs every 4 (four) hours as needed for wheezing.  1 Inhaler  12  . Multiple Vitamin (MULTIVITAMIN WITH MINERALS) TABS Take 1 tablet by mouth daily.        BP 110/78  Pulse 77  Temp 98.8 F (37.1 C) (Oral)  Resp 18  Wt 188 lb (85.276 kg)  SpO2 97%       Review of Systems  Genitourinary: Positive for dysuria, urgency and frequency.       Objective:   Physical Exam  Constitutional: He appears well-developed and well-nourished. No distress.  Abdominal: Soft. Bowel sounds are normal.          Assessment & Plan:   Dysuria. Urinalysis is normal suspect patient has a subacute prostatitis. Will treat with doxycycline for 2 weeks. He will call if unimproved

## 2012-01-18 NOTE — Patient Instructions (Addendum)
Take your antibiotic as prescribed until ALL of it is gone, but stop if you develop a rash, swelling, or any side effects of the medication.  Contact our office as soon as possible if  there are side effects of the medication. 

## 2012-02-01 ENCOUNTER — Encounter: Payer: Self-pay | Admitting: Internal Medicine

## 2012-02-01 ENCOUNTER — Ambulatory Visit (INDEPENDENT_AMBULATORY_CARE_PROVIDER_SITE_OTHER): Payer: No Typology Code available for payment source | Admitting: Internal Medicine

## 2012-02-01 VITALS — BP 124/76 | HR 116 | Temp 97.7°F | Wt 191.0 lb

## 2012-02-01 DIAGNOSIS — H579 Unspecified disorder of eye and adnexa: Secondary | ICD-10-CM

## 2012-02-01 NOTE — Patient Instructions (Signed)
I  dont see a foreign body in the eye from my view but would use OTC eye wash such as collyrium to rinse around the eye.       Warm compresses.  3 x per day  For this.    Would advice  Seeing your eye doctor  .  If not getting better .

## 2012-02-01 NOTE — Progress Notes (Signed)
Chief Complaint  Patient presents with  . Eye Drainage    Says he feels like a hair or something small is in his eye.  Started yesterday morning.    HPI: Patient comes in today for SDA for  new problem evaluation. States he awoke yesterday morning with the feeling of his upper lid like a hair in his thigh he looked and didn't see anything.  This morning was a bit better but still was there he doesn't wear contacts but does glasses it seen his eye doctor a week or so ago   No hx of same and no fb hx . No photophobia or redness?  Vision ?  Blurry  some tearing  .  Discomfort ok.  No history of foreign body in the IMA been dusting the night before but had no symptoms until he woke up.yesterday. No recent cold has been on doxycycline for prostate infection it is getting better and did have GI side effects. ROS: See pertinent positives and negatives per HPI.  Past Medical History  Diagnosis Date  . 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 ANXIETY DEPRESSION 03/09/2009  . ARTHRALGIA 11/13/2008  . DEPRESSION 03/09/2009  . Dermatophytosis of groin and perianal area 05/16/2007  . GERD 09/13/2008  . HEMATURIA, HX OF 05/28/2009  . History of ETOH abuse   . Asthma     Family History  Problem Relation Age of Onset  . Diabetes Mother   . Hypertension Mother   . Cancer Mother     History   Social History  . Marital Status: Married    Spouse Name: N/A    Number of Children: N/A  . Years of Education: N/A   Social History Main Topics  . Smoking status: Current Some Day Smoker -- 0.2 packs/day    Types: Cigarettes  . Smokeless tobacco: Former Neurosurgeon  . Alcohol Use: No  . Drug Use: No  . Sexually Active: None   Other Topics Concern  . None   Social History Narrative  . None    Outpatient Encounter Prescriptions as of 02/01/2012  Medication Sig Dispense Refill  . doxycycline (VIBRA-TABS) 100 MG tablet Take 1 tablet (100 mg total) by mouth 2 (two) times daily.  30 tablet  0  .  GRAPE SEED EXTRACT PO Take 1 tablet by mouth daily.      Marland Kitchen HYDROcodone-acetaminophen (VICODIN ES) 7.5-750 MG per tablet Take 1 tablet by mouth every 6 (six) hours as needed for pain.  30 tablet  0  . ibuprofen (ADVIL,MOTRIN) 200 MG tablet Take 400 mg by mouth every 6 (six) hours as needed. pain      . levalbuterol (XOPENEX HFA) 45 MCG/ACT inhaler Inhale 1-2 puffs into the lungs every 4 (four) hours as needed for wheezing.  1 Inhaler  12  . meloxicam (MOBIC) 7.5 MG tablet Take 1 tablet (7.5 mg total) by mouth daily.  90 tablet  6  . Multiple Vitamin (MULTIVITAMIN WITH MINERALS) TABS Take 1 tablet by mouth daily.      . [DISCONTINUED] dextromethorphan-guaiFENesin (MUCINEX DM) 30-600 MG per 12 hr tablet Take 1 tablet by mouth once.        EXAM:  BP 124/76  Pulse 116  Temp 97.7 F (36.5 C) (Oral)  Wt 191 lb (86.637 kg)  SpO2 98%  There is no height on file to calculate BMI.  GENERAL: vitals reviewed and listed above, alert, oriented, appears well hydrated and in no acute distress  HEENT: atraumatic, conjunctiva  clear,  examination of the right upper lid under magnification I see no foreign bodies slightly red but no ulcer  there is no redness this on I perhaps a little bit of clear drainage no obvious abnormalities on inspection of external nose and ears OP : no lesion edema or exudate   NECK: no obvious masses on inspection palpation no adenopathy   PSYCH: pleasant and cooperative, no obvious depression or anxiety  ASSESSMENT AND PLAN:  Discussed the following assessment and plan:  1. Sensation of foreign body in eye    Cannot see foreign body suggest flushing with eye wash if not better see his eye doctor.     -Patient advised to return or notify health care team  if symptoms worsen or persist or new concerns arise.  Patient Instructions  I  dont see a foreign body in the eye from my view but would use OTC eye wash such as collyrium to rinse around the eye.       Warm  compresses.  3 x per day  For this.    Would advice  Seeing your eye doctor  .  If not getting better .    Neta Mends. Zadin Lange M.D.  Happy birthday!

## 2012-03-12 ENCOUNTER — Telehealth: Payer: Self-pay | Admitting: Internal Medicine

## 2012-03-12 NOTE — Telephone Encounter (Signed)
Patient Information:  Caller Name: Jaimes  Phone: [phone]  Patient: Jose [name], Jose [name]  Gender: Male  DOB: [birth year]  Age: 52 Years  PCP: Eleonore Chiquito [facility])  Office Follow Up:  Does the office need to follow up with this patient?: No  Instructions For The Office: N/A   Symptoms  Reason For Call & Symptoms: Patient states he has cold symptoms, nasal congestion, headache, slight sore throat.  Reviewed Health History In EMR: Yes  Reviewed Medications In EMR: Yes  Reviewed Allergies In EMR: Yes  Reviewed Surgeries / Procedures: Yes  Date of Onset of Symptoms: 03/11/2012  Treatments Tried: Mucinex  Treatments Tried Worked: No  Guideline(s) Used:  Colds  Disposition Per Guideline:   Home Care  Reason For Disposition Reached:   Colds with no complications  Advice Given:  Humidifier:  If the air in your home is dry, use a cool-mist humidifier  Reassurance  It sounds like an uncomplicated cold that we can treat at home.  Colds are very common and may make you feel uncomfortable.  Colds are caused by viruses, and no medicine or "shot" will cure an uncomplicated cold.  Colds are usually not serious.  Here is some care advice that should help.  For a Runny Nose With Profuse Discharge:   Nasal mucus and discharge helps to wash viruses and bacteria out of the nose and sinuses.  Blowing the nose is all that is needed.  If the skin around your nostrils gets irritated, apply a tiny amount of petroleum ointment to the nasal openings once or twice a day.  For a Stuffy Nose - Use Nasal Washes:  Introduction: Saline (salt water) nasal irrigation (nasal wash) is an effective and simple home remedy for treating stuffy nose and sinus congestion. The nose can be irrigated by pouring, spraying, or squirting salt water into the nose and then letting it run back out.  Treatment for Associated Symptoms of Colds:  For muscle aches, headaches, or moderate fever (more than 101  F or 38.9 C): Take acetaminophen every 4 hours.  Sore throat: Try throat lozenges, hard candy, or warm chicken broth.  Cough: Use cough drops.  Hydrate: Drink adequate liquids.  Humidifier:  If the air in your home is dry, use a cool-mist humidifier  Contagiousness:  The cold virus is present in your nasal secretions.  Cover your nose and mouth with a tissue when you sneeze or cough.  Wash your hands frequently with soap and water.  You can return to work or school after the fever is gone and you feel well enough to participate in normal activities.  Expected Course:   Fever may last 2-3 days  Nasal discharge 7-14 days  Cough up to 2-3 weeks.  Call Back If:  Difficulty breathing occurs  Fever lasts more than 3 days  Nasal discharge lasts more than 10 days  Cough lasts more than 3 weeks  You become worse

## 2012-03-14 ENCOUNTER — Ambulatory Visit (INDEPENDENT_AMBULATORY_CARE_PROVIDER_SITE_OTHER): Payer: No Typology Code available for payment source | Admitting: Internal Medicine

## 2012-03-14 ENCOUNTER — Encounter: Payer: Self-pay | Admitting: Internal Medicine

## 2012-03-14 VITALS — BP 102/70 | HR 79 | Temp 98.1°F | Resp 18 | Wt 185.0 lb

## 2012-03-14 DIAGNOSIS — J309 Allergic rhinitis, unspecified: Secondary | ICD-10-CM

## 2012-03-14 MED ORDER — METHYLPREDNISOLONE ACETATE 80 MG/ML IJ SUSP
80.0000 mg | Freq: Once | INTRAMUSCULAR | Status: AC
  Administered 2012-03-14: 80 mg via INTRAMUSCULAR

## 2012-03-14 MED ORDER — MELOXICAM 7.5 MG PO TABS: 7.5000 mg | ORAL_TABLET | Freq: Every day | ORAL | Status: DC

## 2012-03-14 NOTE — Addendum Note (Signed)
Addended by: Jimmye Norman on: 03/14/2012 11:22 AM   Modules accepted: Orders

## 2012-03-14 NOTE — Progress Notes (Signed)
Subjective:    Patient ID: Jose [name], male    DOB: 05-May-1960, 52 y.o.   MRN: [id number]  HPI  52 year old patient who presents with a three-day history of head and chest congestion and mild nonproductive cough ; he feels some mild chest tightness but no frank wheezing. He has a history of mild asthma and allergic rhinitis. Has not required any inhalational medications. He has history of tobacco use and discontinued tobacco products 12 days ago. He has self treated with a nicotine patch. No fever. He feels that this is a allergy flare.  Past Medical History  Diagnosis Date  . 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 ANXIETY DEPRESSION 03/09/2009  . ARTHRALGIA 11/13/2008  . DEPRESSION 03/09/2009  . Dermatophytosis of groin and perianal area 05/16/2007  . GERD 09/13/2008  . HEMATURIA, HX OF 05/28/2009  . History of ETOH abuse   . Asthma     History   Social History  . Marital Status: Married    Spouse Name: N/A    Number of Children: N/A  . Years of Education: N/A   Occupational History  . Not on file.   Social History Main Topics  . Smoking status: Current Some Day Smoker -- 0.20 packs/day    Types: Cigarettes  . Smokeless tobacco: Former Neurosurgeon  . Alcohol Use: No  . Drug Use: No  . Sexually Active: Not on file   Other Topics Concern  . Not on file   Social History Narrative  . No narrative on file    History reviewed. No pertinent past surgical history.  Family History  Problem Relation Age of Onset  . Diabetes Mother   . Hypertension Mother   . Cancer Mother     No Known Allergies  Current Outpatient Prescriptions on File Prior to Visit  Medication Sig Dispense Refill  . GRAPE SEED EXTRACT PO Take 1 tablet by mouth daily.      Marland Kitchen ibuprofen (ADVIL,MOTRIN) 200 MG tablet Take 400 mg by mouth every 6 (six) hours as needed. pain      . levalbuterol (XOPENEX HFA) 45 MCG/ACT inhaler Inhale 1-2 puffs into the lungs every 4 (four) hours as needed for  wheezing.  1 Inhaler  12  . meloxicam (MOBIC) 7.5 MG tablet Take 1 tablet (7.5 mg total) by mouth daily.  90 tablet  6  . Multiple Vitamin (MULTIVITAMIN WITH MINERALS) TABS Take 1 tablet by mouth daily.       No current facility-administered medications on file prior to visit.    BP 102/70  Pulse 79  Temp(Src) 98.1 F (36.7 C) (Oral)  Resp 18  Wt 185 lb (83.915 kg)  BMI 26.94 kg/m2  SpO2 97%       Review of Systems  Constitutional: Positive for fatigue. Negative for fever, chills and appetite change.  HENT: Positive for congestion and rhinorrhea. Negative for hearing loss, ear pain, sore throat, trouble swallowing, neck stiffness, dental problem, voice change and tinnitus.   Eyes: Negative for pain, discharge and visual disturbance.  Respiratory: Positive for cough. Negative for chest tightness, wheezing and stridor.   Cardiovascular: Negative for chest pain, palpitations and leg swelling.  Gastrointestinal: Negative for nausea, vomiting, abdominal pain, diarrhea, constipation, blood in stool and abdominal distention.  Genitourinary: Negative for urgency, hematuria, flank pain, discharge, difficulty urinating and genital sores.  Musculoskeletal: Negative for myalgias, back pain, joint swelling, arthralgias and gait problem.  Skin: Negative for rash.  Neurological: Negative for dizziness, syncope, speech difficulty, weakness,  numbness and headaches.  Hematological: Negative for adenopathy. Does not bruise/bleed easily.  Psychiatric/Behavioral: Negative for behavioral problems and dysphoric mood. The patient is not nervous/anxious.        Objective:   Physical Exam  Constitutional: He is oriented to person, place, and time. He appears well-developed.  HENT:  Head: Normocephalic.  Right Ear: External ear normal.  Left Ear: External ear normal.  Eyes: Conjunctivae and EOM are normal.  Neck: Normal range of motion.  Cardiovascular: Normal rate and normal heart sounds.    Pulmonary/Chest: Effort normal and breath sounds normal. No respiratory distress. He has no wheezes. He has no rales.  Abdominal: Bowel sounds are normal.  Musculoskeletal: Normal range of motion. He exhibits no edema and no tenderness.  Neurological: He is alert and oriented to person, place, and time.  Psychiatric: He has a normal mood and affect. His behavior is normal.          Assessment & Plan:   Flare of allergic rhinitis. Will treat with Depo-Medrol as well as Nasonex. Nonsedating antihistamine also encouraged Patient does have Xopenex rescue available if needed  Tobacco abuse. Continue nicotine patch  Note for work dictated

## 2012-05-29 ENCOUNTER — Encounter: Payer: Self-pay | Admitting: Family Medicine

## 2012-05-29 ENCOUNTER — Ambulatory Visit (INDEPENDENT_AMBULATORY_CARE_PROVIDER_SITE_OTHER): Payer: No Typology Code available for payment source | Admitting: Family Medicine

## 2012-05-29 VITALS — BP 100/60 | Temp 98.5°F | Wt 188.0 lb

## 2012-05-29 DIAGNOSIS — R1032 Left lower quadrant pain: Secondary | ICD-10-CM

## 2012-05-29 DIAGNOSIS — R21 Rash and other nonspecific skin eruption: Secondary | ICD-10-CM

## 2012-05-29 DIAGNOSIS — N489 Disorder of penis, unspecified: Secondary | ICD-10-CM

## 2012-05-29 LAB — POCT URINALYSIS DIPSTICK
Bilirubin, UA: NEGATIVE
Glucose, UA: NEGATIVE
Ketones, UA: NEGATIVE
Leukocytes, UA: NEGATIVE

## 2012-05-29 LAB — CBC WITH DIFFERENTIAL/PLATELET
Eosinophils Relative: 4.2 % (ref 0.0–5.0)
Monocytes Relative: 12 % (ref 3.0–12.0)
Neutrophils Relative %: 54.4 % (ref 43.0–77.0)
Platelets: 175 10*3/uL (ref 150.0–400.0)
RBC: 5 Mil/uL (ref 4.22–5.81)
WBC: 6.4 10*3/uL (ref 4.5–10.5)

## 2012-05-29 NOTE — Patient Instructions (Addendum)
Follow up promptly for any fever, vomiting, or any stool changes or worsening pain.

## 2012-05-29 NOTE — Progress Notes (Signed)
  Subjective:    Patient ID: Jose [name], male    DOB: 12-31-1960, 52 y.o.   MRN: [id number]  HPI Patient seen for the following issues:  Recurrent penile rash for several years. He describes rash that is both painful and itching and he describes as somewhat vesicular. Possibly exacerbated by intercourse. Most recent episode was several days ago and is starting to resolve. Lesions tend to occur in the same place on the penis which is mostly the ventral surface No dysuria. He states he had some type of previous STD testing does not sure what. No penile discharge  Second problem is left lower quadrant intermittent pain for the past one day. Pain is sharp at times. Location is left lower quadrant. No radiation. Symptoms are intermittent. No clear exacerbating features. No alleviating factors. Patient denies any associated nausea, vomiting, diarrhea, constipation, bloody stools, fever, or chills Denies urinary symptoms. No history of kidney stones. No gross hematuria Patient had colonoscopy 2011 which was normal. No history of diverticular changes.  Past Medical History  Diagnosis Date  . 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 ANXIETY DEPRESSION 03/09/2009  . ARTHRALGIA 11/13/2008  . DEPRESSION 03/09/2009  . Dermatophytosis of groin and perianal area 05/16/2007  . GERD 09/13/2008  . HEMATURIA, HX OF 05/28/2009  . History of ETOH abuse   . Asthma    No past surgical history on file.  reports that he has quit smoking. His smoking use included Cigarettes. He smoked 0.20 packs per day. He has quit using smokeless tobacco. He reports that he does not drink alcohol or use illicit drugs. family history includes Cancer in his mother; Diabetes in his mother; and Hypertension in his mother. No Known Allergies    Review of Systems  Constitutional: Negative for fever, chills, appetite change and unexpected weight change.  Cardiovascular: Negative for chest pain.  Gastrointestinal:  Positive for abdominal pain. Negative for nausea, vomiting, diarrhea, constipation, blood in stool and abdominal distention.  Genitourinary: Negative for dysuria and discharge.  Skin: Positive for rash.       Objective:   Physical Exam  Constitutional: He appears well-developed and well-nourished.  HENT:  Mouth/Throat: Oropharynx is clear and moist.  Cardiovascular: Normal rate and regular rhythm.   Pulmonary/Chest: Effort normal and breath sounds normal. No respiratory distress. He has no wheezes. He has no rales.  Abdominal: Soft. Bowel sounds are normal. He exhibits no distension and no mass. There is no rebound and no guarding.  No significant reproducible tenderness at this time.  Musculoskeletal: He exhibits no edema.  Skin:  Patient has some nonspecific mild hypopigmentation ventral surface of penis. No vesicles. No other rash noted.          Assessment & Plan:  #1 recurrent penile rash. Question herpes. Check herpes simplex virus type II IgG. #2 left lower quadrant abdominal pain. Relatively nonfocal exam at this time. No history of known diverticular disease. No obvious stool changes. Colonoscopy as above. Check CBC and urine dipstick

## 2012-05-30 LAB — HSV 2 ANTIBODY, IGG: HSV 2 Glycoprotein G Ab, IgG: 4.13 IV — ABNORMAL HIGH

## 2012-05-30 NOTE — Progress Notes (Signed)
Quick Note:  Left a message for return call. ______ 

## 2012-05-30 NOTE — Progress Notes (Signed)
Quick Note:  Pt is interested in the suppressive therapy, daily medication. Please send to [facility] [address], [facility] pharmacy ______

## 2012-06-04 ENCOUNTER — Telehealth: Payer: Self-pay | Admitting: Internal Medicine

## 2012-06-04 MED ORDER — VALACYCLOVIR HCL 1 G PO TABS: 1000.0000 mg | ORAL_TABLET | Freq: Every day | ORAL | Status: DC

## 2012-06-04 NOTE — Telephone Encounter (Signed)
Rx sent to pharmacy. Pt notified. 

## 2012-06-04 NOTE — Telephone Encounter (Signed)
Pt saw Dr Caryl Never on 5/27. Pt had asked for :  Pt is interested in the suppressive therapy, daily medication. Please send to [facility] [address], [facility] pharmacy   This med was requested by Harriett Sine, but it never got to pharmacy. Can you fill for pt?

## 2012-07-31 ENCOUNTER — Ambulatory Visit (INDEPENDENT_AMBULATORY_CARE_PROVIDER_SITE_OTHER): Payer: No Typology Code available for payment source | Admitting: Internal Medicine

## 2012-07-31 ENCOUNTER — Encounter: Payer: Self-pay | Admitting: Internal Medicine

## 2012-07-31 VITALS — BP 130/90 | HR 68 | Temp 98.6°F | Resp 20 | Wt 185.0 lb

## 2012-07-31 DIAGNOSIS — Z23 Encounter for immunization: Secondary | ICD-10-CM

## 2012-07-31 DIAGNOSIS — L299 Pruritus, unspecified: Secondary | ICD-10-CM

## 2012-07-31 MED ORDER — METHYLPREDNISOLONE ACETATE 80 MG/ML IJ SUSP
80.0000 mg | Freq: Once | INTRAMUSCULAR | Status: AC
  Administered 2012-07-31: 80 mg via INTRAMUSCULAR

## 2012-07-31 NOTE — Patient Instructions (Signed)
Call or return to clinic prn if these symptoms worsen or fail to improve as anticipated. Pruritus  Pruritis is an itch. There are many different problems that can cause an itch. Dry skin is one of the most common causes of itching. Most cases of itching do not require medical attention.  HOME CARE INSTRUCTIONS  Make sure your skin is moistened on a regular basis. A moisturizer that contains petroleum jelly is best for keeping moisture in your skin. If you develop a rash, you may try the following for relief:   Use corticosteroid cream.  Apply cool compresses to the affected areas.  Bathe with Epsom salts or baking soda in the bathwater.  Soak in colloidal oatmeal baths. These are available at your pharmacy.  Apply baking soda paste to the rash. Stir water into baking soda until it reaches a paste-like consistency.  Use an anti-itch lotion.  Take over-the-counter diphenhydramine medicine by mouth as the instructions direct.  Avoid scratching. Scratching may cause the rash to become infected. If itching is very bad, your caregiver may suggest prescription lotions or creams to lessen your symptoms.  Avoid hot showers, which can make itching worse. A cold shower may help with itching as long as you use a moisturizer after the shower. SEEK MEDICAL CARE IF: The itching does not go away after several days. Document Released: 09/01/2010 Document Revised: 03/14/2011 Document Reviewed: 09/01/2010 [facility] Patient Information 2014 [place], [place].

## 2012-07-31 NOTE — Progress Notes (Signed)
  Subjective:    Patient ID: Jose [name], male    DOB: June 22, 1960, 52 y.o.   MRN: [id number]  HPI  52 year old patient who presents with a two-week history of itching involving primarily his lower legs but also some involvement of his arms. Medications include Valtrex which he takes daily for herpes suppression. No wheezing or other complaints. He has been using topical hydrocortisone cream with little benefit.  Past Medical History  Diagnosis Date  . 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 ANXIETY DEPRESSION 03/09/2009  . ARTHRALGIA 11/13/2008  . DEPRESSION 03/09/2009  . Dermatophytosis of groin and perianal area 05/16/2007  . GERD 09/13/2008  . HEMATURIA, HX OF 05/28/2009  . History of ETOH abuse   . Asthma     History   Social History  . Marital Status: Married    Spouse Name: N/A    Number of Children: N/A  . Years of Education: N/A   Occupational History  . Not on file.   Social History Main Topics  . Smoking status: Former Smoker -- 0.20 packs/day    Types: Cigarettes  . Smokeless tobacco: Former Neurosurgeon  . Alcohol Use: No  . Drug Use: No  . Sexually Active: Not on file   Other Topics Concern  . Not on file   Social History Narrative  . No narrative on file    History reviewed. No pertinent past surgical history.  Family History  Problem Relation Age of Onset  . Diabetes Mother   . Hypertension Mother   . Cancer Mother     No Known Allergies  Current Outpatient Prescriptions on File Prior to Visit  Medication Sig Dispense Refill  . GRAPE SEED EXTRACT PO Take 1 tablet by mouth daily.      Marland Kitchen ibuprofen (ADVIL,MOTRIN) 200 MG tablet Take 400 mg by mouth every 6 (six) hours as needed. pain      . levalbuterol (XOPENEX HFA) 45 MCG/ACT inhaler Inhale 1-2 puffs into the lungs every 4 (four) hours as needed for wheezing.  1 Inhaler  12  . meloxicam (MOBIC) 7.5 MG tablet Take 1 tablet (7.5 mg total) by mouth daily.  90 tablet  6  . Multiple Vitamin  (MULTIVITAMIN WITH MINERALS) TABS Take 1 tablet by mouth daily.      . valACYclovir (VALTREX) 1000 MG tablet Take 1 tablet (1,000 mg total) by mouth daily.  30 tablet  6   No current facility-administered medications on file prior to visit.    BP 130/90  Pulse 68  Temp(Src) 98.6 F (37 C) (Oral)  Resp 20  Wt 185 lb (83.915 kg)  BMI 26.94 kg/m2  SpO2 98%       Review of Systems  Skin: Positive for rash.       Objective:   Physical Exam  Constitutional: He appears well-developed and well-nourished. No distress.  Skin:  No obvious rash involving the lower extremities. There did appear to be some hair loss involving his lower extremities.          Assessment & Plan:   Pruritus of the lower extremities. Unclear etiology. Posture t the Valtrex since this is a fairly recent medication. This will be discontinued. He be treated with Depo-Medrol 80. He will call if unimproved OTC Benadryl also recommended

## 2012-10-11 ENCOUNTER — Ambulatory Visit (INDEPENDENT_AMBULATORY_CARE_PROVIDER_SITE_OTHER): Payer: No Typology Code available for payment source | Admitting: *Deleted

## 2012-10-11 DIAGNOSIS — Z23 Encounter for immunization: Secondary | ICD-10-CM

## 2012-11-12 ENCOUNTER — Other Ambulatory Visit (INDEPENDENT_AMBULATORY_CARE_PROVIDER_SITE_OTHER): Payer: No Typology Code available for payment source

## 2012-11-12 DIAGNOSIS — R972 Elevated prostate specific antigen [PSA]: Secondary | ICD-10-CM

## 2012-11-12 DIAGNOSIS — E785 Hyperlipidemia, unspecified: Secondary | ICD-10-CM

## 2012-11-12 DIAGNOSIS — Z0000 Encounter for general adult medical examination without abnormal findings: Secondary | ICD-10-CM

## 2012-11-12 LAB — POCT URINALYSIS DIPSTICK
Bilirubin, UA: NEGATIVE
Blood, UA: NEGATIVE
Ketones, UA: NEGATIVE
Protein, UA: NEGATIVE
Spec Grav, UA: 1.02
pH, UA: 5.5

## 2012-11-12 LAB — CBC WITH DIFFERENTIAL/PLATELET
Basophils Absolute: 0.1 10*3/uL (ref 0.0–0.1)
Basophils Relative: 0.9 % (ref 0.0–3.0)
Eosinophils Absolute: 0.3 10*3/uL (ref 0.0–0.7)
HCT: 46 % (ref 39.0–52.0)
Hemoglobin: 15.6 g/dL (ref 13.0–17.0)
MCV: 91.8 fl (ref 78.0–100.0)
WBC: 6.1 10*3/uL (ref 4.5–10.5)

## 2012-11-12 LAB — BASIC METABOLIC PANEL
Chloride: 103 mEq/L (ref 96–112)
GFR: 97.96 mL/min (ref >60.00)
Glucose, Bld: 96 mg/dL (ref 70–99)
Potassium: 4.8 mEq/L (ref 3.5–5.1)
Sodium: 138 mEq/L (ref 135–145)

## 2012-11-12 LAB — HEPATIC FUNCTION PANEL
ALT: 28 U/L (ref 0–53)
Alkaline Phosphatase: 50 U/L (ref 39–117)
Bilirubin, Direct: 0.1 mg/dL (ref 0.0–0.3)
Total Bilirubin: 0.7 mg/dL (ref 0.3–1.2)
Total Protein: 6.8 g/dL (ref 6.0–8.3)

## 2012-11-12 LAB — LIPID PANEL
Cholesterol: 233 mg/dL — ABNORMAL HIGH (ref 0–200)
HDL: 47.5 mg/dL (ref >39.00)
Triglycerides: 109 mg/dL (ref 0.0–149.0)

## 2012-11-19 ENCOUNTER — Ambulatory Visit (INDEPENDENT_AMBULATORY_CARE_PROVIDER_SITE_OTHER): Payer: No Typology Code available for payment source | Admitting: Internal Medicine

## 2012-11-19 ENCOUNTER — Encounter: Payer: Self-pay | Admitting: Internal Medicine

## 2012-11-19 VITALS — BP 120/80 | HR 64 | Temp 98.0°F | Resp 20 | Ht 69.0 in | Wt 184.0 lb

## 2012-11-19 DIAGNOSIS — Z0000 Encounter for general adult medical examination without abnormal findings: Secondary | ICD-10-CM

## 2012-11-19 MED ORDER — LEVALBUTEROL TARTRATE 45 MCG/ACT IN AERO: 1.0000 | INHALATION_SPRAY | RESPIRATORY_TRACT | Status: DC | PRN

## 2012-11-19 NOTE — Patient Instructions (Signed)
It is important that you exercise regularly, at least 20 minutes 3 to 4 times per week.  If you develop chest pain or shortness of breath seek  medical attention.  Return in one year for follow-up   

## 2012-11-19 NOTE — Progress Notes (Signed)
Subjective:    Patient ID: Jose [name], male    DOB: 1960/10/12, 52 y.o.   MRN: [id number]  HPI Pre-visit discussion using our clinic review tool. No additional management support is needed unless otherwise documented below in the visit note.  52 year old patient who is in today for a preventive health examination. He did have a colonoscopy in 2011. He is followed by urology due to elevated PSA. He has had 2 prostate biopsies previously, the first positive for prostate cancer but the second biopsy was negative for prostate cancer.  PSA at urology recently was up a bit and he is scheduled for a third prostate biopsy in February  Past Medical History  Diagnosis Date  . 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 ANXIETY DEPRESSION 03/09/2009  . ARTHRALGIA 11/13/2008  . DEPRESSION 03/09/2009  . Dermatophytosis of groin and perianal area 05/16/2007  . GERD 09/13/2008  . HEMATURIA, HX OF 05/28/2009  . History of ETOH abuse   . Asthma     History   Social History  . Marital Status: Married    Spouse Name: N/A    Number of Children: N/A  . Years of Education: N/A   Occupational History  . Not on file.   Social History Main Topics  . Smoking status: Former Smoker -- 0.20 packs/day    Types: Cigarettes  . Smokeless tobacco: Former Neurosurgeon  . Alcohol Use: No  . Drug Use: No  . Sexual Activity: Not on file   Other Topics Concern  . Not on file   Social History Narrative  . No narrative on file    History reviewed. No pertinent past surgical history.  Family History  Problem Relation Age of Onset  . Diabetes Mother   . Hypertension Mother   . Cancer Mother     No Known Allergies  Current Outpatient Prescriptions on File Prior to Visit  Medication Sig Dispense Refill  . GRAPE SEED EXTRACT PO Take 1 tablet by mouth daily.      Marland Kitchen ibuprofen (ADVIL,MOTRIN) 200 MG tablet Take 400 mg by mouth every 6 (six) hours as needed. pain      . meloxicam (MOBIC) 7.5 MG tablet Take  1 tablet (7.5 mg total) by mouth daily.  90 tablet  6  . Multiple Vitamin (MULTIVITAMIN WITH MINERALS) TABS Take 1 tablet by mouth daily.      . valACYclovir (VALTREX) 1000 MG tablet Take 1 tablet (1,000 mg total) by mouth daily.  30 tablet  6   No current facility-administered medications on file prior to visit.    BP 120/80  Pulse 64  Temp(Src) 98 F (36.7 C) (Oral)  Resp 20  Ht 5\' 9"  (1.753 m)  Wt 184 lb (83.462 kg)  BMI 27.16 kg/m2  SpO2 96%       Review of Systems  Constitutional: Negative for fever, chills, activity change, appetite change and fatigue.  HENT: Negative for congestion, dental problem, ear pain, hearing loss, mouth sores, rhinorrhea, sinus pressure, sneezing, tinnitus, trouble swallowing and voice change.   Eyes: Negative for photophobia, pain, redness and visual disturbance.  Respiratory: Negative for apnea, cough, choking, chest tightness, shortness of breath and wheezing.   Cardiovascular: Negative for chest pain, palpitations and leg swelling.  Gastrointestinal: Negative for nausea, vomiting, abdominal pain, diarrhea, constipation, blood in stool, abdominal distention, anal bleeding and rectal pain.  Genitourinary: Negative for dysuria, urgency, frequency, hematuria, flank pain, decreased urine volume, discharge, penile swelling, scrotal swelling, difficulty urinating, genital sores and  testicular pain.  Musculoskeletal: Negative for arthralgias, back pain, gait problem, joint swelling, myalgias, neck pain and neck stiffness.  Skin: Negative for color change, rash and wound.  Neurological: Negative for dizziness, tremors, seizures, syncope, facial asymmetry, speech difficulty, weakness, light-headedness, numbness and headaches.  Hematological: Negative for adenopathy. Does not bruise/bleed easily.  Psychiatric/Behavioral: Negative for suicidal ideas, hallucinations, behavioral problems, confusion, sleep disturbance, self-injury, dysphoric mood, decreased  concentration and agitation. The patient is not nervous/anxious.        Objective:   Physical Exam  Constitutional: He appears well-developed and well-nourished.  HENT:  Head: Normocephalic and atraumatic.  Right Ear: External ear normal.  Left Ear: External ear normal.  Nose: Nose normal.  Mouth/Throat: Oropharynx is clear and moist.  Eyes: Conjunctivae and EOM are normal. Pupils are equal, round, and reactive to light. No scleral icterus.  Neck: Normal range of motion. Neck supple. No JVD present. No thyromegaly present.  Cardiovascular: Regular rhythm, normal heart sounds and intact distal pulses.  Exam reveals no gallop and no friction rub.   No murmur heard. Pulmonary/Chest: Effort normal and breath sounds normal. He exhibits no tenderness.  Abdominal: Soft. Bowel sounds are normal. He exhibits no distension and no mass. There is no tenderness.  Genitourinary: Penis normal.  Musculoskeletal: Normal range of motion. He exhibits no edema and no tenderness.  Lymphadenopathy:    He has no cervical adenopathy.  Neurological: He is alert. He has normal reflexes. No cranial nerve deficit. Coordination normal.  Skin: Skin is warm and dry. No rash noted.  Psychiatric: He has a normal mood and affect. His behavior is normal.          Assessment & Plan:   Preventive health examination Elevated PSA. Followup urology Mild dyslipidemia  Modest weight loss encouraged. Regular exercise regimen encouraged Recheck one year

## 2012-11-19 NOTE — Progress Notes (Signed)
Pre-visit discussion using our clinic review tool. No additional management support is needed unless otherwise documented below in the visit note.  

## 2013-02-13 ENCOUNTER — Ambulatory Visit: Payer: No Typology Code available for payment source | Admitting: Internal Medicine

## 2013-02-15 ENCOUNTER — Ambulatory Visit (INDEPENDENT_AMBULATORY_CARE_PROVIDER_SITE_OTHER): Payer: No Typology Code available for payment source | Admitting: Internal Medicine

## 2013-02-15 ENCOUNTER — Encounter: Payer: Self-pay | Admitting: Internal Medicine

## 2013-02-15 VITALS — BP 104/70 | HR 74 | Temp 98.8°F | Wt 189.0 lb

## 2013-02-15 DIAGNOSIS — H60399 Other infective otitis externa, unspecified ear: Secondary | ICD-10-CM

## 2013-02-15 DIAGNOSIS — H6090 Unspecified otitis externa, unspecified ear: Secondary | ICD-10-CM

## 2013-02-15 DIAGNOSIS — J069 Acute upper respiratory infection, unspecified: Secondary | ICD-10-CM

## 2013-02-15 MED ORDER — NEOMYCIN-POLYMYXIN-HC 3.5-10000-1 OT SOLN: 3.0000 [drp] | Freq: Four times a day (QID) | OTIC | Status: DC

## 2013-02-15 NOTE — Patient Instructions (Signed)
Use eardrops as prescribed  Acute sinusitis symptoms for less than 10 days are generally not helped by antibiotic therapy.  Use saline irrigation, warm  moist compresses and over-the-counter decongestants only as directed.  Call if there is no improvement in 5 to 7 days, or sooner if you develop increasing pain, fever, or any new symptoms.  Take 400-600 mg of ibuprofen ( Advil, Motrin) with food every 4 to 6 hours as needed for pain relief or control of fever

## 2013-02-15 NOTE — Progress Notes (Signed)
Subjective:    Patient ID: Jose [name], male    DOB: [birth year], 53 y.o.   MRN: [id number]  HPI  53 year old patient who presents with a 4-5 day history of discomfort in the left ear.  He has attempted cleaning with Q-tips and hydrogen peroxide.  More recently has developed sinus congestion and generalized myalgias.  No documented fever.  Past Medical History  Diagnosis Date  . 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 ANXIETY DEPRESSION 03/09/2009  . ARTHRALGIA 11/13/2008  . DEPRESSION 03/09/2009  . Dermatophytosis of groin and perianal area 05/16/2007  . GERD 09/13/2008  . HEMATURIA, HX OF 05/28/2009  . History of ETOH abuse   . Asthma     History   Social History  . Marital Status: Married    Spouse Name: N/A    Number of Children: N/A  . Years of Education: N/A   Occupational History  . Not on file.   Social History Main Topics  . Smoking status: Former Smoker -- 0.20 packs/day    Types: Cigarettes  . Smokeless tobacco: Former Systems developer  . Alcohol Use: No  . Drug Use: No  . Sexual Activity: Not on file   Other Topics Concern  . Not on file   Social History Narrative  . No narrative on file    History reviewed. No pertinent past surgical history.  Family History  Problem Relation Age of Onset  . Diabetes Mother   . Hypertension Mother   . Cancer Mother     No Known Allergies  Current Outpatient Prescriptions on File Prior to Visit  Medication Sig Dispense Refill  . GRAPE SEED EXTRACT PO Take 1 tablet by mouth daily.      Marland Kitchen ibuprofen (ADVIL,MOTRIN) 200 MG tablet Take 400 mg by mouth every 6 (six) hours as needed. pain      . levalbuterol (XOPENEX HFA) 45 MCG/ACT inhaler Inhale 1-2 puffs into the lungs every 4 (four) hours as needed for wheezing.  1 Inhaler  12  . Multiple Vitamin (MULTIVITAMIN WITH MINERALS) TABS Take 1 tablet by mouth daily.       No current facility-administered medications on file prior to visit.    BP 104/70  Pulse 74   Temp(Src) 98.8 F (37.1 C) (Oral)  Wt 189 lb (85.73 kg)  SpO2 94%       Review of Systems  Constitutional: Positive for activity change, appetite change and fatigue. Negative for fever and chills.  HENT: Positive for ear pain. Negative for congestion, dental problem, hearing loss, sore throat, tinnitus, trouble swallowing and voice change.   Eyes: Negative for pain, discharge and visual disturbance.  Respiratory: Negative for cough, chest tightness, wheezing and stridor.   Cardiovascular: Negative for chest pain, palpitations and leg swelling.  Gastrointestinal: Negative for nausea, vomiting, abdominal pain, diarrhea, constipation, blood in stool and abdominal distention.  Genitourinary: Negative for urgency, hematuria, flank pain, discharge, difficulty urinating and genital sores.  Musculoskeletal: Positive for myalgias. Negative for arthralgias, back pain, gait problem, joint swelling and neck stiffness.  Skin: Negative for rash.  Neurological: Positive for weakness. Negative for dizziness, syncope, speech difficulty, numbness and headaches.  Hematological: Negative for adenopathy. Does not bruise/bleed easily.  Psychiatric/Behavioral: Negative for behavioral problems and dysphoric mood. The patient is not nervous/anxious.        Objective:   Physical Exam  Constitutional: He is oriented to person, place, and time. He appears well-developed.  HENT:  Head: Normocephalic.  Right Ear: External ear normal.  Left Ear: External ear normal.  The left canal is slightly erythematous, but no significant abnormalities.  The left tympanic membrane was normal  Eyes: Conjunctivae and EOM are normal.  Neck: Normal range of motion.  Cardiovascular: Normal rate and normal heart sounds.   Pulmonary/Chest: Breath sounds normal.  Abdominal: Bowel sounds are normal.  Musculoskeletal: Normal range of motion. He exhibits no edema and no tenderness.  Neurological: He is alert and oriented to  person, place, and time.  Psychiatric: He has a normal mood and affect. His behavior is normal.          Assessment & Plan:   Otitis externa Viral URI.  Will treat symptomatically

## 2013-02-15 NOTE — Progress Notes (Signed)
Pre visit review using our clinic review tool, if applicable. No additional management support is needed unless otherwise documented below in the visit note. 

## 2013-02-25 ENCOUNTER — Ambulatory Visit (INDEPENDENT_AMBULATORY_CARE_PROVIDER_SITE_OTHER): Payer: No Typology Code available for payment source | Admitting: Family

## 2013-02-25 ENCOUNTER — Encounter: Payer: Self-pay | Admitting: Family

## 2013-02-25 VITALS — BP 110/60 | HR 79 | Temp 100.6°F | Wt 184.0 lb

## 2013-02-25 DIAGNOSIS — J019 Acute sinusitis, unspecified: Secondary | ICD-10-CM

## 2013-02-25 DIAGNOSIS — R509 Fever, unspecified: Secondary | ICD-10-CM

## 2013-02-25 DIAGNOSIS — B9689 Other specified bacterial agents as the cause of diseases classified elsewhere: Secondary | ICD-10-CM

## 2013-02-25 MED ORDER — AMOXICILLIN-POT CLAVULANATE 875-125 MG PO TABS: 1.0000 | ORAL_TABLET | Freq: Two times a day (BID) | ORAL | Status: DC

## 2013-02-25 NOTE — Patient Instructions (Signed)

## 2013-02-25 NOTE — Progress Notes (Signed)
Subjective:    Patient ID: Jose [name], male    DOB: November 09, 1960, 53 y.o.   MRN: [id number]  HPI  53 year old African American male, nonsmoker originally seen on 02/15/2013 and diagnosed with upper respiratory infection is in today with persistence of symptoms. Continues to have sneezing, cough, congestion, productive cough that is green to yellow phlegm for 2 weeks and worsening. He now has a low-grade fever. Has been taking over-the-counter Sudafed without much relief.  Review of Systems  Constitutional: Positive for fever and chills.  HENT: Positive for congestion, sinus pressure, sneezing and sore throat.   Respiratory: Positive for cough. Negative for shortness of breath and wheezing.   Cardiovascular: Negative.   Gastrointestinal: Negative.   Musculoskeletal: Negative.   Skin: Negative.   Neurological: Negative.   Psychiatric/Behavioral: Negative.    Past Medical History  Diagnosis Date  . 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 ANXIETY DEPRESSION 03/09/2009  . ARTHRALGIA 11/13/2008  . DEPRESSION 03/09/2009  . Dermatophytosis of groin and perianal area 05/16/2007  . GERD 09/13/2008  . HEMATURIA, HX OF 05/28/2009  . History of ETOH abuse   . Asthma     History   Social History  . Marital Status: Married    Spouse Name: N/A    Number of Children: N/A  . Years of Education: N/A   Occupational History  . Not on file.   Social History Main Topics  . Smoking status: Former Smoker -- 0.20 packs/day    Types: Cigarettes  . Smokeless tobacco: Former Systems developer  . Alcohol Use: No  . Drug Use: No  . Sexual Activity: Not on file   Other Topics Concern  . Not on file   Social History Narrative  . No narrative on file    No past surgical history on file.  Family History  Problem Relation Age of Onset  . Diabetes Mother   . Hypertension Mother   . Cancer Mother     No Known Allergies  Current Outpatient Prescriptions on File Prior to Visit  Medication  Sig Dispense Refill  . GRAPE SEED EXTRACT PO Take 1 tablet by mouth daily.      Marland Kitchen ibuprofen (ADVIL,MOTRIN) 200 MG tablet Take 400 mg by mouth every 6 (six) hours as needed. pain      . levalbuterol (XOPENEX HFA) 45 MCG/ACT inhaler Inhale 1-2 puffs into the lungs every 4 (four) hours as needed for wheezing.  1 Inhaler  12  . Multiple Vitamin (MULTIVITAMIN WITH MINERALS) TABS Take 1 tablet by mouth daily.      Marland Kitchen neomycin-polymyxin-hydrocortisone (CORTISPORIN) otic solution Place 3 drops into the left ear 4 (four) times daily.  10 mL  0  . triamcinolone cream (KENALOG) 0.1 %        No current facility-administered medications on file prior to visit.    BP 110/60  Pulse 79  Temp(Src) 100.6 F (38.1 C) (Oral)  Wt 184 lb (83.462 kg)chart     Objective:   Physical Exam  Constitutional: He is oriented to person, place, and time. He appears well-developed and well-nourished.  HENT:  Right Ear: External ear normal.  Left Ear: External ear normal.  Nose: Nose normal.  Mouth/Throat: Oropharynx is clear and moist.  Sinus tenderness to palpation of the frontal and maxillary sinuses bilaterally  Neck: Normal range of motion. Neck supple.  Cardiovascular: Normal rate, regular rhythm and normal heart sounds.   Pulmonary/Chest: Effort normal and breath sounds normal.  Musculoskeletal: Normal range of motion.  Neurological: He is alert and oriented to person, place, and time.  Skin: Skin is warm and dry.  Psychiatric: He has a normal mood and affect.          Assessment & Plan:  Jose [name] was seen today for cough, fever, chills, generalized body aches and otalgia.  Diagnoses and associated orders for this visit:  Acute bacterial sinusitis  Fever, unspecified  Other Orders - amoxicillin-clavulanate (AUGMENTIN) 875-125 MG per tablet; Take 1 tablet by mouth 2 (two) times daily.   Call the office with any questions or concerns. Recheck as scheduled and as needed.

## 2013-02-25 NOTE — Progress Notes (Signed)
Pre visit review using our clinic review tool, if applicable. No additional management support is needed unless otherwise documented below in the visit note. 

## 2013-02-27 ENCOUNTER — Emergency Department (HOSPITAL_COMMUNITY): Payer: 59

## 2013-02-27 ENCOUNTER — Encounter (HOSPITAL_COMMUNITY): Payer: Self-pay | Admitting: Emergency Medicine

## 2013-02-27 ENCOUNTER — Emergency Department (HOSPITAL_COMMUNITY)
Admission: EM | Admit: 2013-02-27 | Discharge: 2013-02-27 | Disposition: A | Discharge status: Home / Self Care | Payer: 59 | Attending: Emergency Medicine | Admitting: Emergency Medicine

## 2013-02-27 DIAGNOSIS — IMO0002 Reserved for concepts with insufficient information to code with codable children: Secondary | ICD-10-CM | POA: Insufficient documentation

## 2013-02-27 DIAGNOSIS — Z8719 Personal history of other diseases of the digestive system: Secondary | ICD-10-CM | POA: Insufficient documentation

## 2013-02-27 DIAGNOSIS — Z79899 Other long term (current) drug therapy: Secondary | ICD-10-CM | POA: Insufficient documentation

## 2013-02-27 DIAGNOSIS — Z87891 Personal history of nicotine dependence: Secondary | ICD-10-CM | POA: Insufficient documentation

## 2013-02-27 DIAGNOSIS — J45909 Unspecified asthma, uncomplicated: Secondary | ICD-10-CM | POA: Insufficient documentation

## 2013-02-27 DIAGNOSIS — Z8659 Personal history of other mental and behavioral disorders: Secondary | ICD-10-CM | POA: Insufficient documentation

## 2013-02-27 DIAGNOSIS — Z792 Long term (current) use of antibiotics: Secondary | ICD-10-CM | POA: Insufficient documentation

## 2013-02-27 DIAGNOSIS — Z8739 Personal history of other diseases of the musculoskeletal system and connective tissue: Secondary | ICD-10-CM | POA: Insufficient documentation

## 2013-02-27 DIAGNOSIS — J111 Influenza due to unidentified influenza virus with other respiratory manifestations: Secondary | ICD-10-CM | POA: Insufficient documentation

## 2013-02-27 DIAGNOSIS — Z862 Personal history of diseases of the blood and blood-forming organs and certain disorders involving the immune mechanism: Secondary | ICD-10-CM | POA: Insufficient documentation

## 2013-02-27 DIAGNOSIS — Z87448 Personal history of other diseases of urinary system: Secondary | ICD-10-CM | POA: Insufficient documentation

## 2013-02-27 DIAGNOSIS — Z8619 Personal history of other infectious and parasitic diseases: Secondary | ICD-10-CM | POA: Insufficient documentation

## 2013-02-27 DIAGNOSIS — R69 Illness, unspecified: Secondary | ICD-10-CM

## 2013-02-27 MED ORDER — SODIUM CHLORIDE 0.9 % IV BOLUS (SEPSIS)
1000.0000 mL | Freq: Once | INTRAVENOUS | Status: AC
  Administered 2013-02-27: 1000 mL via INTRAVENOUS

## 2013-02-27 MED ORDER — ACETAMINOPHEN-CODEINE 120-12 MG/5ML PO SOLN: 10.0000 mL | ORAL | Status: DC | PRN

## 2013-02-27 MED ORDER — ACETAMINOPHEN 325 MG PO TABS
650.0000 mg | ORAL_TABLET | Freq: Once | ORAL | Status: AC
  Administered 2013-02-27: 650 mg via ORAL
  Filled 2013-02-27: qty 2

## 2013-02-27 MED ORDER — IBUPROFEN 400 MG PO TABS
800.0000 mg | ORAL_TABLET | Freq: Once | ORAL | Status: AC
  Administered 2013-02-27: 800 mg via ORAL
  Filled 2013-02-27: qty 2

## 2013-02-27 MED ORDER — GUAIFENESIN ER 1200 MG PO TB12: 1.0000 | ORAL_TABLET | Freq: Two times a day (BID) | ORAL | Status: DC

## 2013-02-27 MED ORDER — IBUPROFEN 800 MG PO TABS: 800.0000 mg | ORAL_TABLET | Freq: Three times a day (TID) | ORAL | Status: DC | PRN

## 2013-02-27 NOTE — ED Provider Notes (Signed)
CSN: [id number]     Arrival date & time 02/27/13  1110 History   First MD Initiated Contact with Patient 02/27/13 1158     Chief Complaint  Patient presents with  . Influenza     (Consider location/radiation/quality/duration/timing/severity/associated sxs/prior Treatment) HPI Patient presents to the emergency department with a ten-day history of cough, nasal congestion, sore throat, body aches, and chills.  The patient, states, that he was seen by his primary care Dr. placed on Augmentin.  Patient, states, that he did not seem to be getting any better.  With this the patient denies chest pain, shortness of breath, nausea, vomiting, diarrhea, headache, blurred vision, weakness, dizziness, or syncope.  The patient, states, that he did not take any medications prior to arrival. Past Medical History  Diagnosis Date  . 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 ANXIETY DEPRESSION 03/09/2009  . ARTHRALGIA 11/13/2008  . DEPRESSION 03/09/2009  . Dermatophytosis of groin and perianal area 05/16/2007  . GERD 09/13/2008  . HEMATURIA, HX OF 05/28/2009  . History of ETOH abuse   . Asthma    History reviewed. No pertinent past surgical history. Family History  Problem Relation Age of Onset  . Diabetes Mother   . Hypertension Mother   . Cancer Mother    History  Substance Use Topics  . Smoking status: Former Smoker -- 0.20 packs/day    Types: Cigarettes  . Smokeless tobacco: Former Systems developer  . Alcohol Use: No    Review of Systems  All other systems negative except as documented in the HPI. All pertinent positives and negatives as reviewed in the HPI.  Allergies  Review of patient's allergies indicates no known allergies.  Home Medications   Current Outpatient Rx  Name  Route  Sig  Dispense  Refill  . acetaminophen (TYLENOL) 500 MG tablet   Oral   Take 1,000 mg by mouth every 6 (six) hours as needed for mild pain.         Marland Kitchen amoxicillin-clavulanate (AUGMENTIN) 875-125 MG per  tablet   Oral   Take 1 tablet by mouth 2 (two) times daily.   20 tablet   0   . GRAPE SEED EXTRACT PO   Oral   Take 1 tablet by mouth daily.         Marland Kitchen ibuprofen (ADVIL,MOTRIN) 200 MG tablet   Oral   Take 400 mg by mouth every 6 (six) hours as needed. pain         . levalbuterol (XOPENEX HFA) 45 MCG/ACT inhaler   Inhalation   Inhale 1-2 puffs into the lungs every 4 (four) hours as needed for wheezing.   1 Inhaler   12   . Multiple Vitamin (MULTIVITAMIN WITH MINERALS) TABS   Oral   Take 1 tablet by mouth daily.         Marland Kitchen neomycin-polymyxin-hydrocortisone (CORTISPORIN) otic solution   Left Ear   Place 3 drops into the left ear 4 (four) times daily.   10 mL   0   . pseudoephedrine (SUDAFED) 30 MG tablet   Oral   Take 30 mg by mouth every 4 (four) hours as needed for congestion.         . triamcinolone cream (KENALOG) 0.1 %   Topical   Apply 1 application topically 2 (two) times daily as needed (for itching).           BP 114/78  Pulse 107  Temp(Src) 99.2 F (37.3 C) (Oral)  Resp 19  Wt  184 lb (83.462 kg)  SpO2 94% Physical Exam  Nursing note and vitals reviewed. Constitutional: He is oriented to person, place, and time. He appears well-developed and well-nourished. No distress.  HENT:  Head: Normocephalic and atraumatic.  Mouth/Throat: Oropharynx is clear and moist.  Eyes: Pupils are equal, round, and reactive to light.  Neck: Normal range of motion. Neck supple.  Cardiovascular: Normal rate, regular rhythm and normal heart sounds.  Exam reveals no gallop and no friction rub.   No murmur heard. Pulmonary/Chest: Effort normal and breath sounds normal. No respiratory distress. He has no wheezes.  Neurological: He is alert and oriented to person, place, and time. He exhibits normal muscle tone. Coordination normal.  Skin: Skin is warm and dry.    ED Course  Procedures (including critical care time) Labs Review Labs Reviewed - No data to  display Imaging Review Dg Chest 2 View  02/27/2013   CLINICAL DATA:  Cough  EXAM: CHEST  2 VIEW  COMPARISON:  CT ANGIO CHEST W/CM &/OR WO/CM dated 08/20/2011  FINDINGS: The heart size and mediastinal contours are within normal limits. Both lungs are clear. The visualized skeletal structures are unremarkable.  IMPRESSION: No active cardiopulmonary disease.   Electronically Signed   By: Maryclare Bean M.D.   On: 02/27/2013 12:58   Patient be treated for influenza-like illness.  Told to rest as much possible.  Return here as needed.  Is advised to followup with primary care Dr. his chest x-ray was reviewed and is no signs of pneumonia.    Brent General, PA-C 02/27/13 [phone]

## 2013-02-27 NOTE — ED Provider Notes (Signed)
Medical screening examination/treatment/procedure(s) were performed by non-physician practitioner and as supervising physician I was immediately available for consultation/collaboration.  EKG Interpretation   None         Kristen N Ward, DO 02/27/13 1348 

## 2013-02-27 NOTE — ED Notes (Signed)
Pt c/o cough, fevers, sore throat and runny nose for past 12 days

## 2013-02-27 NOTE — Discharge Instructions (Signed)
Return here as needed.  Followup with your primary care Dr. rest as much as possible.  Increase her fluid intake

## 2013-02-27 NOTE — ED Notes (Signed)
Upon discharge, patient had elevated hr, lower bp, and fever had gone up.   I advised Lawyer, PA and decided to hold patient from discharge until after he receives 1 L fluid bolus, tylenol and ibuprofen.   Patient does have IV at this time with fluid bolus.   Patient agreeable and understands delay in discharge.   Paperwork held at desk until patient ready to leave.

## 2013-02-27 NOTE — ED Notes (Signed)
Reviewed additional discharge instructions with patient, Verbalized understanding. Pt. Alert and oriented x4. Stable for discharge.

## 2013-04-02 ENCOUNTER — Ambulatory Visit (INDEPENDENT_AMBULATORY_CARE_PROVIDER_SITE_OTHER): Payer: No Typology Code available for payment source | Admitting: Internal Medicine

## 2013-04-02 ENCOUNTER — Encounter: Payer: Self-pay | Admitting: Internal Medicine

## 2013-04-02 VITALS — BP 100/60 | HR 102 | Temp 100.2°F | Resp 20 | Ht 69.0 in | Wt 176.0 lb

## 2013-04-02 DIAGNOSIS — J45901 Unspecified asthma with (acute) exacerbation: Secondary | ICD-10-CM | POA: Insufficient documentation

## 2013-04-02 MED ORDER — AZITHROMYCIN 250 MG PO TABS: ORAL_TABLET | ORAL | Status: DC

## 2013-04-02 NOTE — Progress Notes (Signed)
Subjective:    Patient ID: Jose [name], male    DOB: 1960-05-27, 53 y.o.   MRN: [id number]  HPI  52 year old patient who has a history of chronic stable asthma.  He is a former smoker.  For the past 4 days.  She has developed worsening chest congestion and cough.  Cough has been minimally productive for the past 2 days.  She has developed fever, myalgias, and a general sense of unwellness.  He states the last time he had a similar illness.  He worsen in was forced to be evaluated in the ED.  No significant wheezing.  Past Medical History  Diagnosis Date  . 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 ANXIETY DEPRESSION 03/09/2009  . ARTHRALGIA 11/13/2008  . DEPRESSION 03/09/2009  . Dermatophytosis of groin and perianal area 05/16/2007  . GERD 09/13/2008  . HEMATURIA, HX OF 05/28/2009  . History of ETOH abuse   . Asthma     History   Social History  . Marital Status: Single    Spouse Name: N/A    Number of Children: N/A  . Years of Education: N/A   Occupational History  . Not on file.   Social History Main Topics  . Smoking status: Former Smoker -- 0.20 packs/day    Types: Cigarettes  . Smokeless tobacco: Former Systems developer  . Alcohol Use: No  . Drug Use: No  . Sexual Activity: Not on file   Other Topics Concern  . Not on file   Social History Narrative  . No narrative on file    History reviewed. No pertinent past surgical history.  Family History  Problem Relation Age of Onset  . Diabetes Mother   . Hypertension Mother   . Cancer Mother     No Known Allergies  Current Outpatient Prescriptions on File Prior to Visit  Medication Sig Dispense Refill  . acetaminophen (TYLENOL) 500 MG tablet Take 1,000 mg by mouth every 6 (six) hours as needed for mild pain.      Marland Kitchen acetaminophen-codeine 120-12 MG/5ML solution Take 10 mLs by mouth every 4 (four) hours as needed for moderate pain.  120 mL  0  . Guaifenesin 1200 MG TB12 Take 1 tablet (1,200 mg total) by mouth 2  (two) times daily.  20 each  0  . ibuprofen (ADVIL,MOTRIN) 200 MG tablet Take 400 mg by mouth every 6 (six) hours as needed. pain      . ibuprofen (ADVIL,MOTRIN) 800 MG tablet Take 1 tablet (800 mg total) by mouth every 8 (eight) hours as needed.  21 tablet  0  . levalbuterol (XOPENEX HFA) 45 MCG/ACT inhaler Inhale 1-2 puffs into the lungs every 4 (four) hours as needed for wheezing.  1 Inhaler  12  . Multiple Vitamin (MULTIVITAMIN WITH MINERALS) TABS Take 1 tablet by mouth daily.      . pseudoephedrine (SUDAFED) 30 MG tablet Take 30 mg by mouth every 4 (four) hours as needed for congestion.      . triamcinolone cream (KENALOG) 0.1 % Apply 1 application topically 2 (two) times daily as needed (for itching).        No current facility-administered medications on file prior to visit.    BP 100/60  Pulse 102  Temp(Src) 100.2 F (37.9 C) (Oral)  Resp 20  Ht 5\' 9"  (1.753 m)  Wt 176 lb (79.833 kg)  BMI 25.98 kg/m2  SpO2 96%       Review of Systems  Constitutional: Positive for fever, activity  change, appetite change and fatigue. Negative for chills.  HENT: Negative for congestion, dental problem, ear pain, hearing loss, sore throat, tinnitus, trouble swallowing and voice change.   Eyes: Negative for pain, discharge and visual disturbance.  Respiratory: Positive for cough and shortness of breath. Negative for chest tightness, wheezing and stridor.   Cardiovascular: Negative for chest pain, palpitations and leg swelling.  Gastrointestinal: Negative for nausea, vomiting, abdominal pain, diarrhea, constipation, blood in stool and abdominal distention.  Genitourinary: Negative for urgency, hematuria, flank pain, discharge, difficulty urinating and genital sores.  Musculoskeletal: Negative for arthralgias, back pain, gait problem, joint swelling, myalgias and neck stiffness.  Skin: Negative for rash.  Neurological: Positive for weakness. Negative for dizziness, syncope, speech difficulty,  numbness and headaches.  Hematological: Negative for adenopathy. Does not bruise/bleed easily.  Psychiatric/Behavioral: Negative for behavioral problems and dysphoric mood. The patient is not nervous/anxious.        Objective:   Physical Exam  Constitutional: He is oriented to person, place, and time. He appears well-developed.  Appears unwell, but in no acute distress Temperature 100.2 Pulse 100 O2 saturation 96  HENT:  Head: Normocephalic.  Right Ear: External ear normal.  Left Ear: External ear normal.  Eyes: Conjunctivae and EOM are normal.  Neck: Normal range of motion.  Cardiovascular: Normal rate and normal heart sounds.   Pulmonary/Chest: Effort normal.  Few  scattered rhonchi.  No active wheezing  Abdominal: Bowel sounds are normal.  Musculoskeletal: Normal range of motion. He exhibits no edema and no tenderness.  Neurological: He is alert and oriented to person, place, and time.  Psychiatric: He has a normal mood and affect. His behavior is normal.          Assessment & Plan:   Acute asthmatic bronchitis.  We'll treat with expectorants Tylenol and azithromycin.  He is asked to force fluids and rest We'll call if unimproved

## 2013-04-02 NOTE — Patient Instructions (Signed)
Take over-the-counter expectorants and cough medications such as  Mucinex DM.  Call if there is no improvement in 5 to 7 days or if he developed worsening cough, fever, or new symptoms, such as shortness of breath or chest pain.  Take your antibiotic as prescribed until ALL of it is gone, but stop if you develop a rash, swelling, or any side effects of the medication.  Contact our office as soon as possible if  there are side effects of the medication.

## 2013-04-02 NOTE — Progress Notes (Signed)
Pre-visit discussion using our clinic review tool. No additional management support is needed unless otherwise documented below in the visit note.  

## 2013-05-14 ENCOUNTER — Ambulatory Visit (INDEPENDENT_AMBULATORY_CARE_PROVIDER_SITE_OTHER): Payer: 59 | Admitting: Internal Medicine

## 2013-05-14 ENCOUNTER — Encounter: Payer: Self-pay | Admitting: Internal Medicine

## 2013-05-14 VITALS — BP 114/80 | HR 79 | Temp 98.6°F | Resp 20 | Ht 69.0 in | Wt 180.0 lb

## 2013-05-14 DIAGNOSIS — F341 Dysthymic disorder: Secondary | ICD-10-CM

## 2013-05-14 DIAGNOSIS — K219 Gastro-esophageal reflux disease without esophagitis: Secondary | ICD-10-CM

## 2013-05-14 NOTE — Patient Instructions (Signed)
It is important that you exercise regularly, at least 20 minutes 3 to 4 times per week.  If you develop chest pain or shortness of breath seek  medical attention. \Return in 6 months for follow-up  Call or return to clinic prn if these symptoms worsen or fail to improve as anticipated.  

## 2013-05-14 NOTE — Progress Notes (Signed)
Subjective:    Patient ID: Jose [name], male    DOB: 12-26-1960, 53 y.o.   MRN: [id number]  HPI  53 year old patient who has a history of asthma.  He also has a history of anxiety, depression.  Complaints today include mild headache discomfort in the right lateral neck area and altered taste sensation.  All for about one week.  No fever.  His asthma has been stable.  No significant allergy symptoms.  Past Medical History  Diagnosis Date  . 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 ANXIETY DEPRESSION 03/09/2009  . ARTHRALGIA 11/13/2008  . DEPRESSION 03/09/2009  . Dermatophytosis of groin and perianal area 05/16/2007  . GERD 09/13/2008  . HEMATURIA, HX OF 05/28/2009  . History of ETOH abuse   . Asthma     History   Social History  . Marital Status: Single    Spouse Name: N/A    Number of Children: N/A  . Years of Education: N/A   Occupational History  . Not on file.   Social History Main Topics  . Smoking status: Former Smoker -- 0.20 packs/day    Types: Cigarettes  . Smokeless tobacco: Former Systems developer  . Alcohol Use: No  . Drug Use: No  . Sexual Activity: Not on file   Other Topics Concern  . Not on file   Social History Narrative  . No narrative on file    History reviewed. No pertinent past surgical history.  Family History  Problem Relation Age of Onset  . Diabetes Mother   . Hypertension Mother   . Cancer Mother     No Known Allergies  Current Outpatient Prescriptions on File Prior to Visit  Medication Sig Dispense Refill  . acetaminophen (TYLENOL) 500 MG tablet Take 1,000 mg by mouth every 6 (six) hours as needed for mild pain.      Marland Kitchen ibuprofen (ADVIL,MOTRIN) 200 MG tablet Take 400 mg by mouth every 6 (six) hours as needed. pain      . Multiple Vitamin (MULTIVITAMIN WITH MINERALS) TABS Take 1 tablet by mouth daily.      Marland Kitchen triamcinolone cream (KENALOG) 0.1 % Apply 1 application topically 2 (two) times daily as needed (for itching).       Marland Kitchen  levalbuterol (XOPENEX HFA) 45 MCG/ACT inhaler Inhale 1-2 puffs into the lungs every 4 (four) hours as needed for wheezing.  1 Inhaler  12   No current facility-administered medications on file prior to visit.    BP 114/80  Pulse 79  Temp(Src) 98.6 F (37 C) (Oral)  Resp 20  Ht 5\' 9"  (1.753 m)  Wt 180 lb (81.647 kg)  BMI 26.57 kg/m2  SpO2 97%       Review of Systems  Constitutional: Negative for fever, chills, appetite change and fatigue.  HENT: Negative for congestion, dental problem, ear pain, hearing loss, sore throat, tinnitus, trouble swallowing and voice change.   Eyes: Negative for pain, discharge and visual disturbance.  Respiratory: Negative for cough, chest tightness, wheezing and stridor.   Cardiovascular: Negative for chest pain, palpitations and leg swelling.  Gastrointestinal: Negative for nausea, vomiting, abdominal pain, diarrhea, constipation, blood in stool and abdominal distention.  Genitourinary: Negative for urgency, hematuria, flank pain, discharge, difficulty urinating and genital sores.  Musculoskeletal: Negative for arthralgias, back pain, gait problem, joint swelling, myalgias and neck stiffness.  Skin: Negative for rash.  Neurological: Positive for headaches. Negative for dizziness, syncope, speech difficulty, weakness and numbness.  Hematological: Negative for adenopathy. Does not  bruise/bleed easily.  Psychiatric/Behavioral: Negative for behavioral problems and dysphoric mood. The patient is not nervous/anxious.        Objective:   Physical Exam  Constitutional: He is oriented to person, place, and time. He appears well-developed.  HENT:  Head: Normocephalic.  Right Ear: External ear normal.  Left Ear: External ear normal.  Eyes: Conjunctivae and EOM are normal.  Neck: Normal range of motion.  Cardiovascular: Normal rate and normal heart sounds.   Pulmonary/Chest: Breath sounds normal. No respiratory distress. He has no wheezes.  Abdominal:  Bowel sounds are normal.  Musculoskeletal: Normal range of motion. He exhibits no edema and no tenderness.  Lymphadenopathy:    He has no cervical adenopathy.  Neurological: He is alert and oriented to person, place, and time.  Psychiatric: He has a normal mood and affect. His behavior is normal.          Assessment & Plan:   Asthma stable Altered taste Right neck pain.  Unremarkable exam  Patient reassured.  Will observe.  Patient will report any clinical change.  Otherwise, will see in the fall for his annual exam

## 2013-05-14 NOTE — Progress Notes (Signed)
Pre-visit discussion using our clinic review tool. No additional management support is needed unless otherwise documented below in the visit note.  

## 2013-06-07 ENCOUNTER — Telehealth: Payer: Self-pay | Admitting: Internal Medicine

## 2013-06-07 MED ORDER — VALACYCLOVIR HCL 1 G PO TABS: 1000.0000 mg | ORAL_TABLET | Freq: Every day | ORAL | Status: DC

## 2013-06-07 NOTE — Telephone Encounter (Signed)
[id number] - [place], [place]. Is requesting re-fill on valACYclovir (VALTREX) 1000 MG tablet

## 2013-06-07 NOTE — Telephone Encounter (Signed)
Rx sent 

## 2013-10-02 ENCOUNTER — Ambulatory Visit (INDEPENDENT_AMBULATORY_CARE_PROVIDER_SITE_OTHER): Payer: 59

## 2013-10-02 DIAGNOSIS — Z23 Encounter for immunization: Secondary | ICD-10-CM

## 2013-10-12 IMAGING — CR DG CHEST 2V
2 series · 2 of 2 positions shown · non-contrast
Comparison: None.

CLINICAL DATA: Respiratory distress, chest pain.

CHEST - 2 VIEW

[w chest pa]
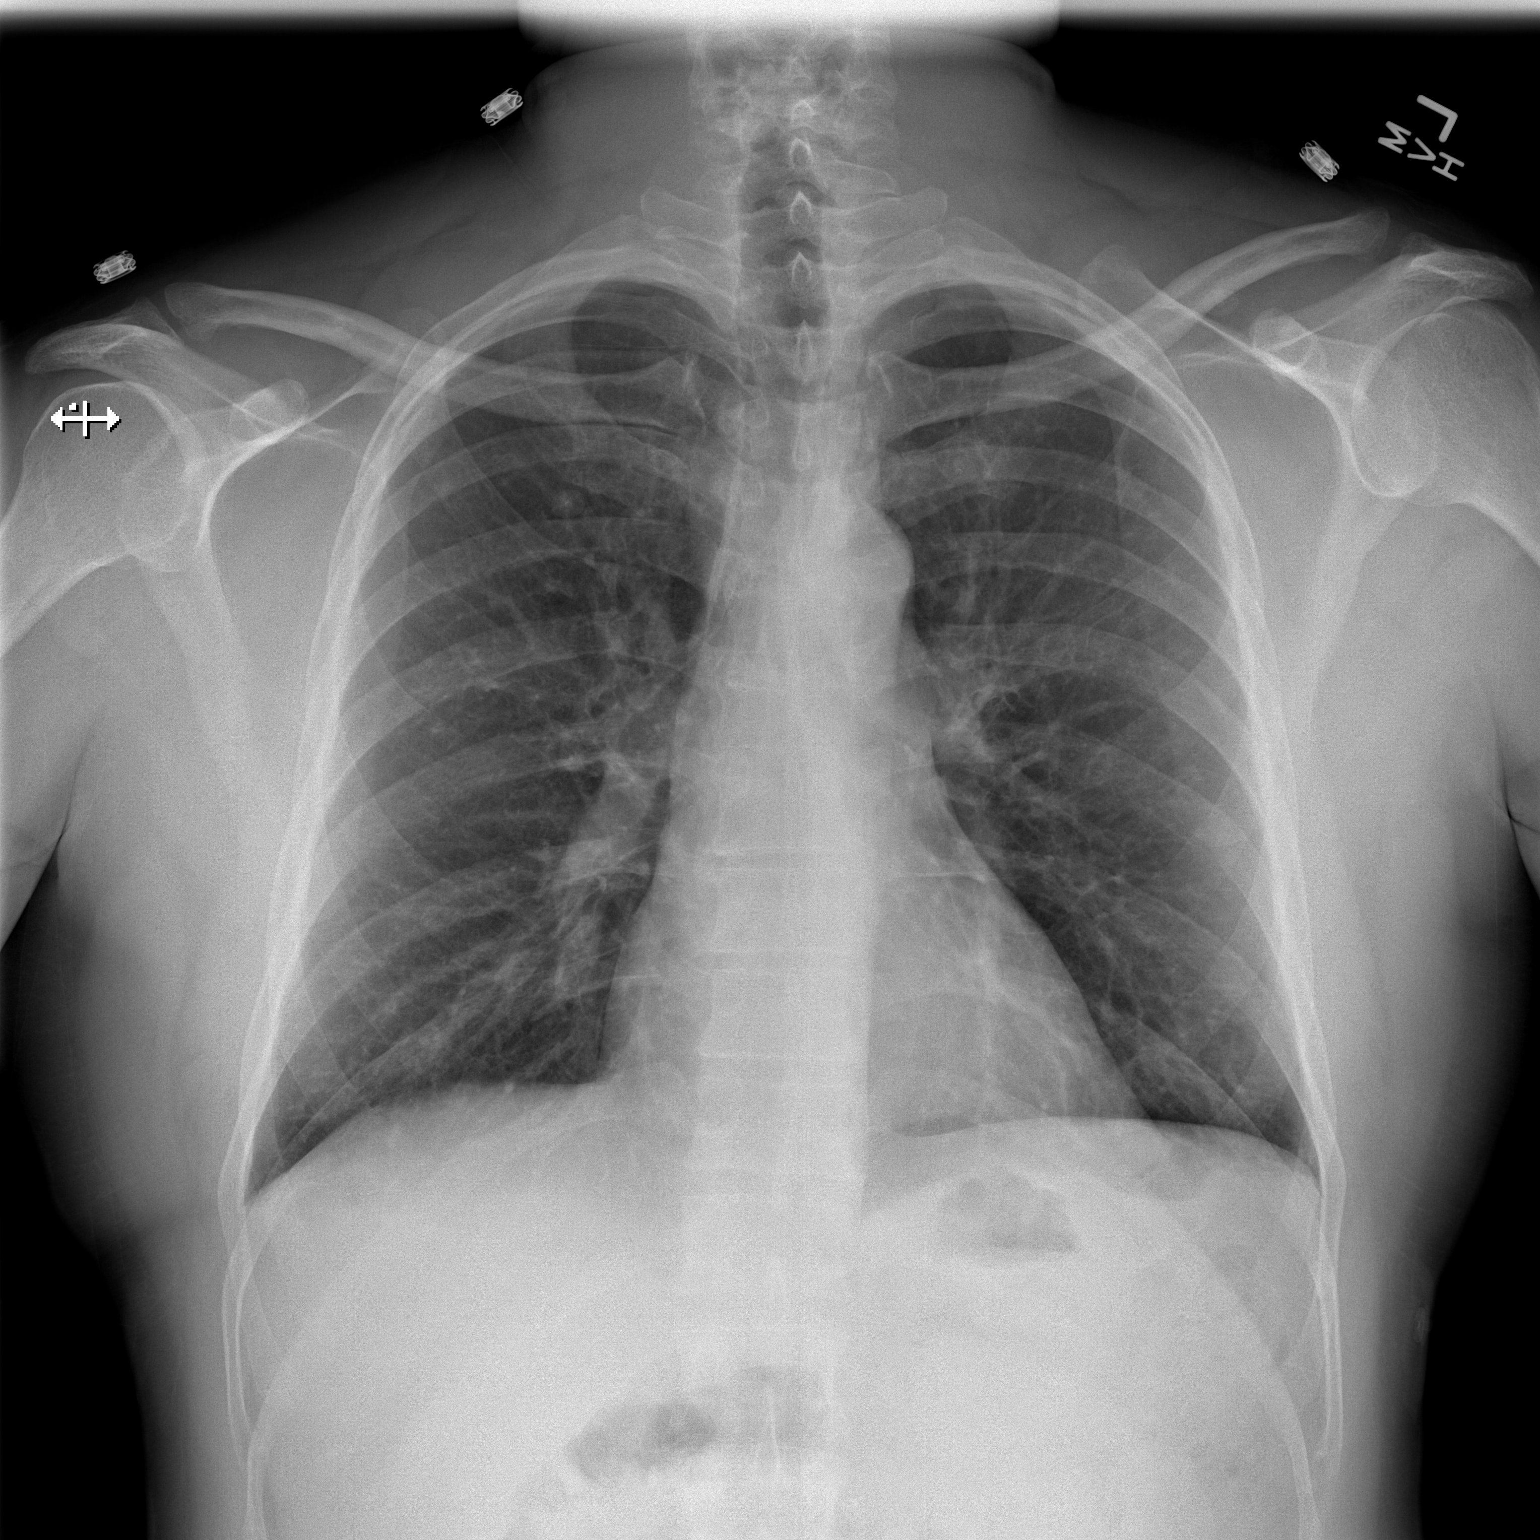

[w chest lat]
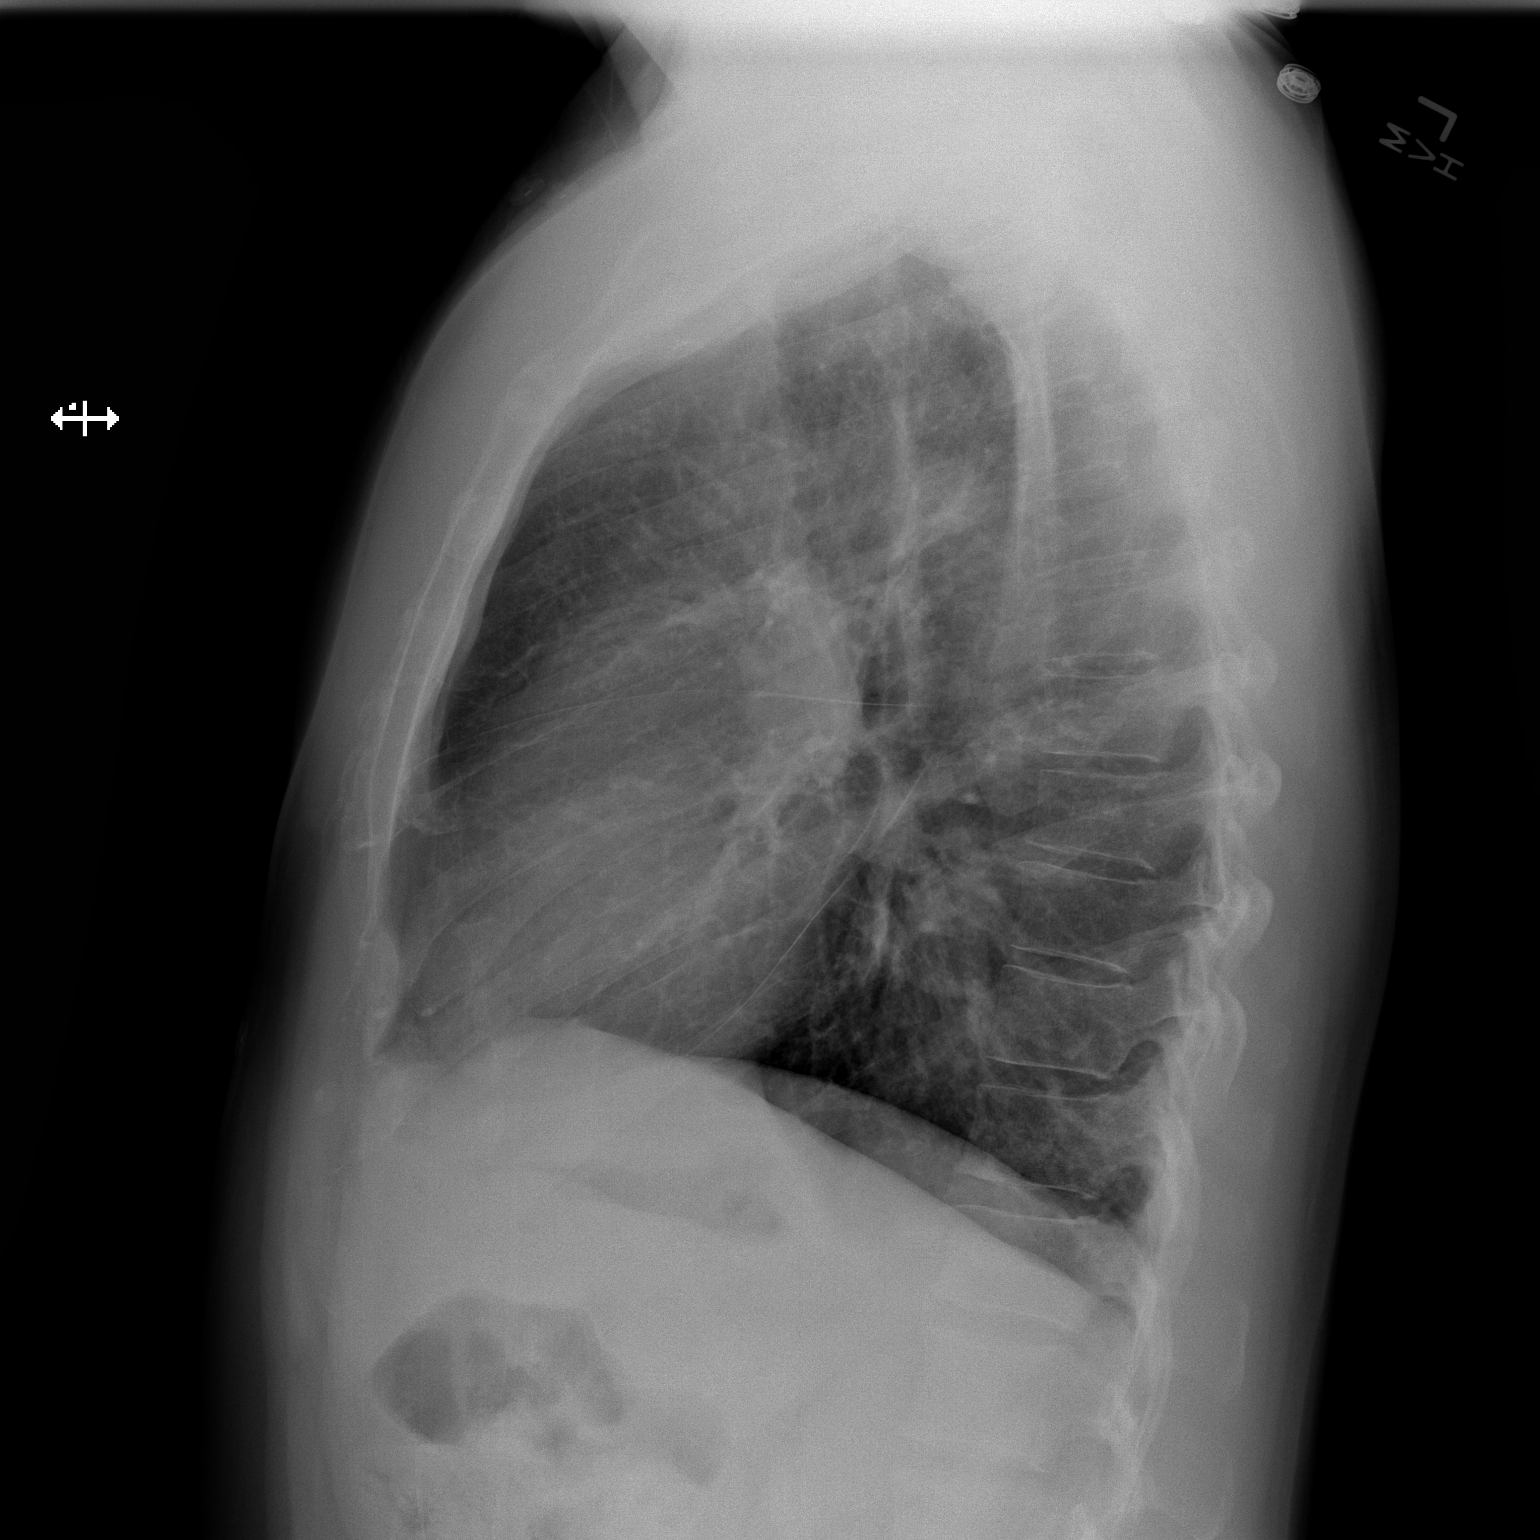

[2 of 2 positions shown; findings below may reference images not displayed]

FINDINGS: Cardiomediastinal silhouette appears normal.  No acute
pulmonary disease is noted.  Bony thorax is intact.
IMPRESSION: No acute cardiopulmonary abnormality seen.

## 2014-01-07 ENCOUNTER — Encounter: Payer: Self-pay | Admitting: Internal Medicine

## 2014-01-07 ENCOUNTER — Ambulatory Visit (INDEPENDENT_AMBULATORY_CARE_PROVIDER_SITE_OTHER): Payer: 59 | Admitting: Internal Medicine

## 2014-01-07 VITALS — BP 120/80 | HR 71 | Temp 98.2°F | Resp 20 | Ht 69.0 in | Wt 186.0 lb

## 2014-01-07 DIAGNOSIS — C61 Malignant neoplasm of prostate: Secondary | ICD-10-CM

## 2014-01-07 DIAGNOSIS — A6000 Herpesviral infection of urogenital system, unspecified: Secondary | ICD-10-CM

## 2014-01-07 NOTE — Progress Notes (Signed)
   Subjective:    Patient ID: Jose [name], male    DOB: [birth year], 54 y.o.   MRN: [id number]  HPI 54 year old patient who is seen today to discuss abnormal serology obtained after donating blood.  The patient has a remote history of treated syphilis.  Serology confirmed remote but an active infection.  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  He has been with a single partner for approximately one year.  Past Medical History  Diagnosis Date  . 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 ANXIETY DEPRESSION 03/09/2009  . ARTHRALGIA 11/13/2008  . DEPRESSION 03/09/2009  . Dermatophytosis of groin and perianal area 05/16/2007  . GERD 09/13/2008  . HEMATURIA, HX OF 05/28/2009  . History of ETOH abuse   . Asthma     History   Social History  . Marital Status: Single    Spouse Name: N/A    Number of Children: N/A  . Years of Education: N/A   Occupational History  . Not on file.   Social History Main Topics  . Smoking status: Former Smoker -- 0.20 packs/day    Types: Cigarettes  . Smokeless tobacco: Former Systems developer  . Alcohol Use: No  . Drug Use: No  . Sexual Activity: Not on file   Other Topics Concern  . Not on file   Social History Narrative    No past surgical history on file.  Family History  Problem Relation Age of Onset  . Diabetes Mother   . Hypertension Mother   . Cancer Mother     No Known Allergies  Current Outpatient Prescriptions on File Prior to Visit  Medication Sig Dispense Refill  . acetaminophen (TYLENOL) 500 MG tablet Take 1,000 mg by mouth every 6 (six) hours as needed for mild pain.    Marland Kitchen ibuprofen (ADVIL,MOTRIN) 200 MG tablet Take 400 mg by mouth every 6 (six) hours as needed. pain    . Multiple Vitamin (MULTIVITAMIN WITH MINERALS) TABS Take 1 tablet by mouth daily.    Marland Kitchen triamcinolone cream (KENALOG) 0.1 % Apply 1 application topically 2 (two) times daily as needed (for itching).     . valACYclovir (VALTREX) 1000 MG tablet Take 1  tablet (1,000 mg total) by mouth daily. 30 tablet 2   No current facility-administered medications on file prior to visit.    BP 120/80 mmHg  Pulse 71  Temp(Src) 98.2 F (36.8 C) (Oral)  Resp 20  Ht 5\' 9"  (1.753 m)  Wt 186 lb (84.369 kg)  BMI 27.45 kg/m2  SpO2 97%      Review of Systems  Skin: Positive for rash.       Objective:   Physical Exam  Constitutional: He appears well-developed and well-nourished. No distress.  Genitourinary: Penis normal.          Assessment & Plan:   Recurrent herpes genitalis.  This has been no stable on present regimen.  Symptoms responded promptly to oral and topical antivirals Remote history of syphilis.  Serology discussed at length  CPX as scheduled

## 2014-01-07 NOTE — Patient Instructions (Signed)
Annual CPX as scheduled

## 2014-01-07 NOTE — Progress Notes (Signed)
Pre visit review using our clinic review tool, if applicable. No additional management support is needed unless otherwise documented below in the visit note. 

## 2014-02-03 ENCOUNTER — Other Ambulatory Visit (INDEPENDENT_AMBULATORY_CARE_PROVIDER_SITE_OTHER): Payer: 59

## 2014-02-03 DIAGNOSIS — Z0000 Encounter for general adult medical examination without abnormal findings: Secondary | ICD-10-CM

## 2014-02-03 LAB — POCT URINALYSIS DIPSTICK
BILIRUBIN UA: NEGATIVE
Blood, UA: NEGATIVE
Glucose, UA: NEGATIVE
Ketones, UA: NEGATIVE
Leukocytes, UA: NEGATIVE
Nitrite, UA: NEGATIVE
PH UA: 5.5
PROTEIN UA: NEGATIVE
Spec Grav, UA: 1.02
Urobilinogen, UA: 0.2

## 2014-02-03 LAB — LIPID PANEL
CHOL/HDL RATIO: 4
CHOLESTEROL: 215 mg/dL — AB (ref 0–200)
HDL: 54.8 mg/dL (ref >39.00)
LDL Cholesterol: 139 mg/dL — ABNORMAL HIGH (ref 0–99)
NonHDL: 160.2
Triglycerides: 105 mg/dL (ref 0.0–149.0)
VLDL: 21 mg/dL (ref 0.0–40.0)

## 2014-02-03 LAB — PSA: PSA: 6.14 ng/mL — ABNORMAL HIGH (ref 0.10–4.00)

## 2014-02-03 LAB — HEPATIC FUNCTION PANEL
ALK PHOS: 58 U/L (ref 39–117)
ALT: 20 U/L (ref 0–53)
AST: 18 U/L (ref 0–37)
Albumin: 4.2 g/dL (ref 3.5–5.2)
BILIRUBIN DIRECT: 0.1 mg/dL (ref 0.0–0.3)
BILIRUBIN TOTAL: 0.4 mg/dL (ref 0.2–1.2)
TOTAL PROTEIN: 6.8 g/dL (ref 6.0–8.3)

## 2014-02-03 LAB — CBC WITH DIFFERENTIAL/PLATELET
BASOS ABS: 0.1 10*3/uL (ref 0.0–0.1)
BASOS PCT: 1.7 % (ref 0.0–3.0)
Eosinophils Absolute: 0.3 10*3/uL (ref 0.0–0.7)
Eosinophils Relative: 4.9 % (ref 0.0–5.0)
HCT: 43.1 % (ref 39.0–52.0)
Hemoglobin: 14.6 g/dL (ref 13.0–17.0)
LYMPHS ABS: 1.6 10*3/uL (ref 0.7–4.0)
LYMPHS PCT: 31 % (ref 12.0–46.0)
MCHC: 33.8 g/dL (ref 30.0–36.0)
MCV: 91 fl (ref 78.0–100.0)
MONO ABS: 0.5 10*3/uL (ref 0.1–1.0)
Monocytes Relative: 10.2 % (ref 3.0–12.0)
NEUTROS PCT: 52.2 % (ref 43.0–77.0)
Neutro Abs: 2.7 10*3/uL (ref 1.4–7.7)
Platelets: 161 10*3/uL (ref 150.0–400.0)
RBC: 4.74 Mil/uL (ref 4.22–5.81)
RDW: 13.7 % (ref 11.5–15.5)
WBC: 5.1 10*3/uL (ref 4.0–10.5)

## 2014-02-03 LAB — TSH: TSH: 1.84 u[IU]/mL (ref 0.35–4.50)

## 2014-02-03 LAB — BASIC METABOLIC PANEL
BUN: 11 mg/dL (ref 6–23)
CO2: 26 meq/L (ref 19–32)
CREATININE: 1 mg/dL (ref 0.40–1.50)
Calcium: 9.6 mg/dL (ref 8.4–10.5)
Chloride: 106 mEq/L (ref 96–112)
GFR: 99.77 mL/min (ref >60.00)
Glucose, Bld: 95 mg/dL (ref 70–99)
Potassium: 4.1 mEq/L (ref 3.5–5.1)
Sodium: 139 mEq/L (ref 135–145)

## 2014-02-10 ENCOUNTER — Ambulatory Visit (INDEPENDENT_AMBULATORY_CARE_PROVIDER_SITE_OTHER): Payer: 59 | Admitting: Internal Medicine

## 2014-02-10 ENCOUNTER — Encounter: Payer: Self-pay | Admitting: Internal Medicine

## 2014-02-10 VITALS — BP 130/80 | HR 74 | Temp 98.1°F | Resp 20 | Ht 69.0 in | Wt 187.0 lb

## 2014-02-10 DIAGNOSIS — K219 Gastro-esophageal reflux disease without esophagitis: Secondary | ICD-10-CM

## 2014-02-10 DIAGNOSIS — Z0000 Encounter for general adult medical examination without abnormal findings: Secondary | ICD-10-CM

## 2014-02-10 DIAGNOSIS — C61 Malignant neoplasm of prostate: Secondary | ICD-10-CM

## 2014-02-10 NOTE — Progress Notes (Signed)
Pre visit review using our clinic review tool, if applicable. No additional management support is needed unless otherwise documented below in the visit note. 

## 2014-02-10 NOTE — Progress Notes (Signed)
Subjective:    Patient ID: Jose [name], male    DOB: Sep 07, 1960, 54 y.o.   MRN: [id number]  HPI  Pre-visit discussion using our clinic review tool. No additional management support is needed unless otherwise documented below in the visit note.  54 year-old patient who is in today for a preventive health examination.  He did have a colonoscopy in 2011. He is followed by urology due to prostate cancer   No new concerns or complaints.  He does have a regular gym membership and has made attempts to eat healthier.  He has been avoiding fried foods.  His lipid profile was reviewed and was nicely improved.  His asthma has been stable  Past Medical History  Diagnosis Date  . 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 ANXIETY DEPRESSION 03/09/2009  . ARTHRALGIA 11/13/2008  . DEPRESSION 03/09/2009  . Dermatophytosis of groin and perianal area 05/16/2007  . GERD 09/13/2008  . HEMATURIA, HX OF 05/28/2009  . History of ETOH abuse   . Asthma     History   Social History  . Marital Status: Single    Spouse Name: N/A    Number of Children: N/A  . Years of Education: N/A   Occupational History  . Not on file.   Social History Main Topics  . Smoking status: Former Smoker -- 0.20 packs/day    Types: Cigarettes  . Smokeless tobacco: Former Systems developer  . Alcohol Use: No  . Drug Use: No  . Sexual Activity: Not on file   Other Topics Concern  . Not on file   Social History Narrative    No past surgical history on file.  Family History  Problem Relation Age of Onset  . Diabetes Mother   . Hypertension Mother   . Cancer Mother     No Known Allergies  Current Outpatient Prescriptions on File Prior to Visit  Medication Sig Dispense Refill  . acetaminophen (TYLENOL) 500 MG tablet Take 1,000 mg by mouth every 6 (six) hours as needed for mild pain.    Marland Kitchen ibuprofen (ADVIL,MOTRIN) 200 MG tablet Take 400 mg by mouth every 6 (six) hours as needed. pain    . Multiple Vitamin  (MULTIVITAMIN WITH MINERALS) TABS Take 1 tablet by mouth daily.    Marland Kitchen triamcinolone cream (KENALOG) 0.1 % Apply 1 application topically 2 (two) times daily as needed (for itching).     . valACYclovir (VALTREX) 1000 MG tablet Take 1 tablet (1,000 mg total) by mouth daily. 30 tablet 2   No current facility-administered medications on file prior to visit.    BP 130/80 mmHg  Pulse 74  Temp(Src) 98.1 F (36.7 C) (Oral)  Resp 20  Ht 5\' 9"  (1.753 m)  Wt 187 lb (84.823 kg)  BMI 27.60 kg/m2  SpO2 97%       Review of Systems  Constitutional: Negative for fever, chills, activity change, appetite change and fatigue.  HENT: Negative for congestion, dental problem, ear pain, hearing loss, mouth sores, rhinorrhea, sinus pressure, sneezing, tinnitus, trouble swallowing and voice change.   Eyes: Negative for photophobia, pain, redness and visual disturbance.  Respiratory: Negative for apnea, cough, choking, chest tightness, shortness of breath and wheezing.   Cardiovascular: Negative for chest pain, palpitations and leg swelling.  Gastrointestinal: Negative for nausea, vomiting, abdominal pain, diarrhea, constipation, blood in stool, abdominal distention, anal bleeding and rectal pain.  Genitourinary: Negative for dysuria, urgency, frequency, hematuria, flank pain, decreased urine volume, discharge, penile swelling, scrotal swelling, difficulty urinating, genital  sores and testicular pain.  Musculoskeletal: Positive for arthralgias. Negative for myalgias, back pain, joint swelling, gait problem, neck pain and neck stiffness.       He has knee pain and is scheduled to see orthopedics soon  Skin: Negative for color change, rash and wound.  Neurological: Negative for dizziness, tremors, seizures, syncope, facial asymmetry, speech difficulty, weakness, light-headedness, numbness and headaches.  Hematological: Negative for adenopathy. Does not bruise/bleed easily.  Psychiatric/Behavioral: Negative for  suicidal ideas, hallucinations, behavioral problems, confusion, sleep disturbance, self-injury, dysphoric mood, decreased concentration and agitation. The patient is not nervous/anxious.        Objective:   Physical Exam  Constitutional: He appears well-developed and well-nourished.  HENT:  Head: Normocephalic and atraumatic.  Right Ear: External ear normal.  Left Ear: External ear normal.  Nose: Nose normal.  Mouth/Throat: Oropharynx is clear and moist.  Eyes: Conjunctivae and EOM are normal. Pupils are equal, round, and reactive to light. No scleral icterus.  Neck: Normal range of motion. Neck supple. No JVD present. No thyromegaly present.  Cardiovascular: Regular rhythm, normal heart sounds and intact distal pulses.  Exam reveals no gallop and no friction rub.   No murmur heard. Pulmonary/Chest: Effort normal and breath sounds normal. He exhibits no tenderness.  Abdominal: Soft. Bowel sounds are normal. He exhibits no distension and no mass. There is no tenderness.  Genitourinary: Penis normal.  Musculoskeletal: Normal range of motion. He exhibits no edema or tenderness.  Lymphadenopathy:    He has no cervical adenopathy.  Neurological: He is alert. He has normal reflexes. No cranial nerve deficit. Coordination normal.  Skin: Skin is warm and dry. No rash noted.  Psychiatric: He has a normal mood and affect. His behavior is normal.          Assessment & Plan:   Preventive health examination Prostate cancer  Mild dyslipidemia  Modest weight loss encouraged. Regular exercise regimen encouraged as well as heart healthy diet Recheck one year Urology follow-up as scheduled

## 2014-02-10 NOTE — Patient Instructions (Signed)

## 2014-02-24 ENCOUNTER — Other Ambulatory Visit: Payer: Self-pay | Admitting: Internal Medicine

## 2014-06-19 ENCOUNTER — Telehealth: Payer: Self-pay | Admitting: Internal Medicine

## 2014-06-19 MED ORDER — ACYCLOVIR 200 MG PO CAPS: ORAL_CAPSULE | ORAL | Status: DC

## 2014-06-19 NOTE — Telephone Encounter (Signed)
Pt would like to change the rx valACYclovir (VALTREX) 1000 MG tablet to something else. Pt state he is having side effects from the med:  Blood in urine after bm (which he read online as a side effect)  [facility]/ [address]

## 2014-06-19 NOTE — Telephone Encounter (Signed)
Please see message and advise 

## 2014-06-19 NOTE — Telephone Encounter (Signed)
Spoke to pt, told him Dr.K said this is very unlikely side effect of Valtrex, suggest office visit. Pt said he was at Urologist last month when this happened and urine was checked and it was negative. Pt said this is the second time this happened on Valtrex. Told pt to hang on. Discussed with Dr.K again, gave order for Acyclovir 200 mg and pt to follow up if it happens again. Told pt will send Rx for Acyclovir  200 mg capsules, take 5 capsules daily for 3 days as needed for outbreak to pharmacy. If notices blood again in urine needs office visit. Pt verbalized understanding. Rx sent.

## 2014-06-19 NOTE — Telephone Encounter (Signed)
This is a very unlikely side effect of the Valtrex.  Suggest office follow-up for evaluation of hematuria

## 2014-07-10 ENCOUNTER — Ambulatory Visit (INDEPENDENT_AMBULATORY_CARE_PROVIDER_SITE_OTHER): Payer: 59 | Admitting: Internal Medicine

## 2014-07-10 ENCOUNTER — Encounter: Payer: Self-pay | Admitting: Internal Medicine

## 2014-07-10 ENCOUNTER — Telehealth: Payer: Self-pay | Admitting: Internal Medicine

## 2014-07-10 VITALS — BP 100/70 | HR 69 | Temp 98.3°F | Resp 18 | Ht 69.0 in | Wt 182.0 lb

## 2014-07-10 DIAGNOSIS — C61 Malignant neoplasm of prostate: Secondary | ICD-10-CM | POA: Diagnosis not present

## 2014-07-10 NOTE — Telephone Encounter (Signed)
Patient Name: Jose [name] DOB: Aug 04, 1960 Initial Comment caller states he has a sore throat Nurse Assessment Nurse: Ronnald Ramp, RN, Miranda Date/Time (Eastern Time): 07/10/2014 9:18:26 AM Confirm and document reason for call. If symptomatic, describe symptoms. ---Caller states he has been having a sore throat for 2 days. He has since called back and made an appt to be seen today at 11am. Also has body aches. Has the patient traveled out of the country within the last 30 days? ---No Does the patient require triage? ---Yes Related visit to physician within the last 2 weeks? ---No Does the PT have any chronic conditions? (i.e. diabetes, asthma, etc.) ---Yes List chronic conditions. ---Prostate Caner - undergoing treatment now. Guidelines Guideline Title Affirmed Question Affirmed Notes Sore Throat Diabetes mellitus or weak immune system (e.g., HIV positive, cancer chemo, splenectomy, organ transplant) Final Disposition User See Physician within 24 Hours Rector, Therapist, sports, [employer]

## 2014-07-10 NOTE — Patient Instructions (Signed)
Take 400-600 mg of ibuprofen ( Advil, Motrin) with food every 4 to 6 hours as needed for pain relief or control of fever    Take over-the-counter expectorants and cough medications such as  Mucinex DM.  Call if there is no improvement in 5 to 7 days or if  you develop worsening cough, fever, or new symptoms, such as shortness of breath or chest pain.  HOME CARE INSTRUCTIONS  Get plenty of rest.  Drink enough fluids to keep your urine clear or pale yellow (unless you have a medical condition that requires fluid restriction). Increasing fluids may help thin your respiratory secretions (sputum) and reduce chest congestion, and it will prevent dehydration

## 2014-07-10 NOTE — Progress Notes (Signed)
Subjective:    Patient ID: Jose [name], male    DOB: 21-Jul-1960, 54 y.o.   MRN: [id number]  HPI  54 year old patient who presents with a 2 day history of sore throat, achiness, chills and general sense of unwellness.  There is been some mild nonproductive cough. He has a history of prostate cancer and history of hematuria.  He is scheduled to see urology tomorrow  Past Medical History  Diagnosis Date  . 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 ANXIETY DEPRESSION 03/09/2009  . ARTHRALGIA 11/13/2008  . DEPRESSION 03/09/2009  . Dermatophytosis of groin and perianal area 05/16/2007  . GERD 09/13/2008  . HEMATURIA, HX OF 05/28/2009  . History of ETOH abuse   . Asthma     History   Social History  . Marital Status: Single    Spouse Name: N/A  . Number of Children: N/A  . Years of Education: N/A   Occupational History  . Not on file.   Social History Main Topics  . Smoking status: Former Smoker -- 0.20 packs/day    Types: Cigarettes  . Smokeless tobacco: Former Systems developer  . Alcohol Use: No  . Drug Use: No  . Sexual Activity: Not on file   Other Topics Concern  . Not on file   Social History Narrative    No past surgical history on file.  Family History  Problem Relation Age of Onset  . Diabetes Mother   . Hypertension Mother   . Cancer Mother     No Known Allergies  Current Outpatient Prescriptions on File Prior to Visit  Medication Sig Dispense Refill  . acetaminophen (TYLENOL) 500 MG tablet Take 1,000 mg by mouth every 6 (six) hours as needed for mild pain.    Marland Kitchen acyclovir (ZOVIRAX) 200 MG capsule TAKE 5 CAPSULES DAILY X 3 DAYS FOR OUTBREAK AS NEEDED 30 capsule 5  . ibuprofen (ADVIL,MOTRIN) 200 MG tablet Take 400 mg by mouth every 6 (six) hours as needed. pain    . meloxicam (MOBIC) 7.5 MG tablet TAKE 1 TABLET (7.5 MG TOTAL) BY MOUTH DAILY. 90 tablet 1  . Multiple Vitamin (MULTIVITAMIN WITH MINERALS) TABS Take 1 tablet by mouth daily.    Marland Kitchen triamcinolone  cream (KENALOG) 0.1 % Apply 1 application topically 2 (two) times daily as needed (for itching).     . valACYclovir (VALTREX) 1000 MG tablet Take 1 tablet (1,000 mg total) by mouth daily. 30 tablet 2   No current facility-administered medications on file prior to visit.    BP 100/70 mmHg  Pulse 69  Temp(Src) 98.3 F (36.8 C) (Oral)  Resp 18  Ht 5\' 9"  (1.753 m)  Wt 182 lb (82.555 kg)  BMI 26.86 kg/m2  SpO2 98%     Review of Systems  Constitutional: Positive for activity change, appetite change and fatigue. Negative for fever and chills.  HENT: Positive for sore throat. Negative for congestion, dental problem, ear pain, hearing loss, tinnitus, trouble swallowing and voice change.   Eyes: Negative for pain, discharge and visual disturbance.  Respiratory: Positive for cough. Negative for chest tightness, wheezing and stridor.   Cardiovascular: Negative for chest pain, palpitations and leg swelling.  Gastrointestinal: Negative for nausea, vomiting, abdominal pain, diarrhea, constipation, blood in stool and abdominal distention.  Genitourinary: Positive for hematuria. Negative for urgency, flank pain, discharge, difficulty urinating and genital sores.  Musculoskeletal: Positive for myalgias. Negative for back pain, joint swelling, arthralgias, gait problem and neck stiffness.  Skin: Negative for rash.  Neurological: Positive for weakness. Negative for dizziness, syncope, speech difficulty, numbness and headaches.  Hematological: Negative for adenopathy. Does not bruise/bleed easily.  Psychiatric/Behavioral: Negative for behavioral problems and dysphoric mood. The patient is not nervous/anxious.        Objective:   Physical Exam  Constitutional: He is oriented to person, place, and time. He appears well-developed.  Appears unwell, but in no distress Afebrile Pulse slow and regular  HENT:  Head: Normocephalic.  Right Ear: External ear normal.  Left Ear: External ear normal.    Eyes: Conjunctivae and EOM are normal.  Neck: Normal range of motion.  Cardiovascular: Normal rate and normal heart sounds.   Pulmonary/Chest: Breath sounds normal. No respiratory distress. He has no wheezes. He has no rales. He exhibits no tenderness.  Abdominal: Bowel sounds are normal. He exhibits no distension. There is no tenderness. There is no rebound.  Musculoskeletal: Normal range of motion. He exhibits no edema or tenderness.  Neurological: He is alert and oriented to person, place, and time.  Psychiatric: He has a normal mood and affect. His behavior is normal.          Assessment & Plan:   Viral syndrome.  Will treat symptomatically.  A letter to excuse from work for a couple days was written History of prostate cancer.  Follow-up urology tomorrow as scheduled  Return as needed Report any new or worsening symptoms CPX 6 months

## 2014-07-10 NOTE — Progress Notes (Signed)
Pre visit review using our clinic review tool, if applicable. No additional management support is needed unless otherwise documented below in the visit note. 

## 2014-07-10 NOTE — Telephone Encounter (Signed)
Noted  

## 2014-11-20 ENCOUNTER — Ambulatory Visit (INDEPENDENT_AMBULATORY_CARE_PROVIDER_SITE_OTHER): Payer: 59 | Admitting: Family Medicine

## 2014-11-20 ENCOUNTER — Encounter: Payer: Self-pay | Admitting: Family Medicine

## 2014-11-20 VITALS — BP 100/80 | HR 80 | Temp 98.3°F | Resp 14 | Ht 69.0 in | Wt 184.3 lb

## 2014-11-20 DIAGNOSIS — M542 Cervicalgia: Secondary | ICD-10-CM

## 2014-11-20 NOTE — Progress Notes (Signed)
   Subjective:    Patient ID: Jose [name], male    DOB: [birth year], 54 y.o.   MRN: [id number]  HPI  Acute visit for left neck pain.  Duration about 2 weeks Fleeting- about 1-2 seconds- pain Quality is sharp to "burning" Locations is left anterior neck without radiation Severity is mild. Triggers- noted more with work stress Alleviating- none NO chest pain. No recent exertional symptoms and exercises Pertinent negative-NO weight loss, appetite change, rash, adenopathy, sore throat, pain with chewing.  Pt was concerned b/o hx of prostate cancer hx.  No worrisome red flags as above.  He is followed closely by Urology for his prostate cancer.  Past Medical History  Diagnosis Date  . 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 ANXIETY DEPRESSION 03/09/2009  . ARTHRALGIA 11/13/2008  . DEPRESSION 03/09/2009  . Dermatophytosis of groin and perianal area 05/16/2007  . GERD 09/13/2008  . HEMATURIA, HX OF 05/28/2009  . History of ETOH abuse   . Asthma    No past surgical history on file.  reports that he has quit smoking. His smoking use included Cigarettes. He smoked 0.20 packs per day. He has quit using smokeless tobacco. He reports that he does not drink alcohol or use illicit drugs. family history includes Cancer in his mother; Diabetes in his mother; Hypertension in his mother. No Known Allergies    Review of Systems  Constitutional: Negative for fever, chills, appetite change and unexpected weight change.  HENT: Negative for sore throat and trouble swallowing.   Respiratory: Negative for shortness of breath.   Cardiovascular: Negative for chest pain.  Skin: Negative for rash.  Neurological: Negative for headaches.  Hematological: Negative for adenopathy.       Objective:   Physical Exam  Constitutional: He appears well-developed and well-nourished. No distress.  HENT:  Head: Normocephalic and atraumatic.  Right Ear: External ear normal.  Left Ear: External  ear normal.  Mouth/Throat: Oropharynx is clear and moist.  No parotid enlargement. No TMJ tenderness. No dental caries or evidence for gum abscess  Neck: Neck supple.  Cardiovascular: Normal rate and regular rhythm.   Pulmonary/Chest: Effort normal and breath sounds normal. No respiratory distress. He has no wheezes. He has no rales.  Lymphadenopathy:    He has no cervical adenopathy.  Neurological: He is alert. No cranial nerve deficit.  Skin: No rash noted.          Assessment & Plan:  Left neck pain. Non-focal exam.  Very fleeting pain-?neulropathic.  No evidence for TMJ, dental abscess, etc.  Recommend observe for now.   Follow up for any rash, progressive- or persistent pain.

## 2014-11-20 NOTE — Patient Instructions (Signed)
Follow up for any fever, lymph node enlargement, or weight changes.

## 2015-02-09 ENCOUNTER — Other Ambulatory Visit (INDEPENDENT_AMBULATORY_CARE_PROVIDER_SITE_OTHER): Payer: PRIVATE HEALTH INSURANCE

## 2015-02-09 DIAGNOSIS — Z0000 Encounter for general adult medical examination without abnormal findings: Secondary | ICD-10-CM | POA: Diagnosis not present

## 2015-02-09 LAB — POC URINALSYSI DIPSTICK (AUTOMATED)
Bilirubin, UA: NEGATIVE
Blood, UA: NEGATIVE
Glucose, UA: NEGATIVE
KETONES UA: NEGATIVE
LEUKOCYTES UA: NEGATIVE
NITRITE UA: NEGATIVE
PROTEIN UA: NEGATIVE
Spec Grav, UA: 1.025
Urobilinogen, UA: 0.2
pH, UA: 5.5

## 2015-02-09 LAB — BASIC METABOLIC PANEL
BUN: 12 mg/dL (ref 6–23)
CALCIUM: 9.9 mg/dL (ref 8.4–10.5)
CO2: 29 mEq/L (ref 19–32)
CREATININE: 1.02 mg/dL (ref 0.40–1.50)
Chloride: 105 mEq/L (ref 96–112)
GFR: 97.15 mL/min (ref >60.00)
Glucose, Bld: 94 mg/dL (ref 70–99)
Potassium: 4.7 mEq/L (ref 3.5–5.1)
Sodium: 142 mEq/L (ref 135–145)

## 2015-02-09 LAB — CBC WITH DIFFERENTIAL/PLATELET
Basophils Absolute: 0 10*3/uL (ref 0.0–0.1)
Basophils Relative: 0.8 % (ref 0.0–3.0)
EOS ABS: 0.2 10*3/uL (ref 0.0–0.7)
Eosinophils Relative: 4.3 % (ref 0.0–5.0)
HEMATOCRIT: 47.1 % (ref 39.0–52.0)
HEMOGLOBIN: 15.5 g/dL (ref 13.0–17.0)
LYMPHS PCT: 28.5 % (ref 12.0–46.0)
Lymphs Abs: 1.6 10*3/uL (ref 0.7–4.0)
MCHC: 32.8 g/dL (ref 30.0–36.0)
MCV: 92.9 fl (ref 78.0–100.0)
Monocytes Absolute: 0.6 10*3/uL (ref 0.1–1.0)
Monocytes Relative: 11 % (ref 3.0–12.0)
Neutro Abs: 3.2 10*3/uL (ref 1.4–7.7)
Neutrophils Relative %: 55.4 % (ref 43.0–77.0)
Platelets: 186 10*3/uL (ref 150.0–400.0)
RBC: 5.07 Mil/uL (ref 4.22–5.81)
RDW: 13.3 % (ref 11.5–15.5)
WBC: 5.8 10*3/uL (ref 4.0–10.5)

## 2015-02-09 LAB — LIPID PANEL
Cholesterol: 223 mg/dL — ABNORMAL HIGH (ref 0–200)
HDL: 55.6 mg/dL (ref >39.00)
LDL Cholesterol: 151 mg/dL — ABNORMAL HIGH (ref 0–99)
NonHDL: 167.6
TRIGLYCERIDES: 83 mg/dL (ref 0.0–149.0)
Total CHOL/HDL Ratio: 4
VLDL: 16.6 mg/dL (ref 0.0–40.0)

## 2015-02-09 LAB — HEPATIC FUNCTION PANEL
ALT: 26 U/L (ref 0–53)
AST: 19 U/L (ref 0–37)
Albumin: 4.2 g/dL (ref 3.5–5.2)
Alkaline Phosphatase: 56 U/L (ref 39–117)
Bilirubin, Direct: 0.1 mg/dL (ref 0.0–0.3)
TOTAL PROTEIN: 7 g/dL (ref 6.0–8.3)
Total Bilirubin: 0.6 mg/dL (ref 0.2–1.2)

## 2015-02-09 LAB — PSA: PSA: 8.31 ng/mL — ABNORMAL HIGH (ref 0.10–4.00)

## 2015-02-09 LAB — TSH: TSH: 1.31 u[IU]/mL (ref 0.35–4.50)

## 2015-02-16 ENCOUNTER — Ambulatory Visit (INDEPENDENT_AMBULATORY_CARE_PROVIDER_SITE_OTHER): Payer: PRIVATE HEALTH INSURANCE | Admitting: Internal Medicine

## 2015-02-16 ENCOUNTER — Encounter: Payer: Self-pay | Admitting: Internal Medicine

## 2015-02-16 VITALS — BP 108/70 | HR 68 | Temp 98.0°F | Resp 20 | Ht 68.75 in | Wt 182.0 lb

## 2015-02-16 DIAGNOSIS — Z0000 Encounter for general adult medical examination without abnormal findings: Secondary | ICD-10-CM | POA: Diagnosis not present

## 2015-02-16 DIAGNOSIS — K219 Gastro-esophageal reflux disease without esophagitis: Secondary | ICD-10-CM

## 2015-02-16 DIAGNOSIS — C61 Malignant neoplasm of prostate: Secondary | ICD-10-CM

## 2015-02-16 NOTE — Progress Notes (Signed)
Pre visit review using our clinic review tool, if applicable. No additional management support is needed unless otherwise documented below in the visit note. 

## 2015-02-16 NOTE — Progress Notes (Signed)
Subjective:    Patient ID: Jose [name], male    DOB: 07-14-1960, 55 y.o.   MRN: [id number]  HPI   55 year-old patient who is in today for a preventive health examination.  He did have a colonoscopy in 2011. He is followed by urology due to prostate cancer   No new concerns or complaints.  He does have a regular gym membership and has made attempts to eat healthier.  He has been avoiding fried foods.  His lipid profile was reviewed and was nicely improved.  His asthma has been stable  Past Medical History  Diagnosis Date  . Acquired coagulation factor deficiency ([place]) 05/28/2009  . ANXIETY DEPRESSION 03/09/2009  . ARTHRALGIA 11/13/2008  . DEPRESSION 03/09/2009  . Dermatophytosis of groin and perianal area 05/16/2007  . GERD 09/13/2008  . HEMATURIA, HX OF 05/28/2009  . History of ETOH abuse   . Asthma     Social History   Social History  . Marital Status: Single    Spouse Name: N/A  . Number of Children: N/A  . Years of Education: N/A   Occupational History  . Not on file.   Social History Main Topics  . Smoking status: Former Smoker -- 0.20 packs/day    Types: Cigarettes  . Smokeless tobacco: Former Systems developer  . Alcohol Use: No  . Drug Use: No  . Sexual Activity: Not on file   Other Topics Concern  . Not on file   Social History Narrative      Family History  Problem Relation Age of Onset  . Diabetes Mother   . Hypertension Mother   . Cancer Mother     No Known Allergies  Current Outpatient Prescriptions on File Prior to Visit  Medication Sig Dispense Refill  . acyclovir (ZOVIRAX) 200 MG capsule TAKE 5 CAPSULES DAILY X 3 DAYS FOR OUTBREAK AS NEEDED 30 capsule 5  . ibuprofen (ADVIL,MOTRIN) 200 MG tablet Take 400 mg by mouth every 6 (six) hours as needed. pain    . meloxicam (MOBIC) 7.5 MG tablet TAKE 1 TABLET (7.5 MG TOTAL) BY MOUTH DAILY. 90 tablet 1  . triamcinolone cream (KENALOG) 0.1 % Apply 1 application topically 2 (two) times daily as needed (for  itching).      No current facility-administered medications on file prior to visit.       Allergies: No Known Drug Allergies  Past History:  Past Medical History: history of EtOH substance abuse GERD Abdominal pain Hyperlipidemia  Past Surgical History:  Denies surgical history except right knee arthroscopic surgery    Family History:   father died age 86, lung cancer mother died age 60, chronic kidney disease, history of EtOH and hypertension  Four sisters positive for arthritis  Social History:   Occupation:   Custodian  Married Former Smoker Alcohol use-no Drug use-no Regular exercise-yes history of alcohol and substance abuse    Review of Systems  Constitutional: Negative for fever, chills, activity change, appetite change and fatigue.  HENT: Negative for congestion, dental problem, ear pain, hearing loss, mouth sores, rhinorrhea, sinus pressure, sneezing, tinnitus, trouble swallowing and voice change.   Eyes: Negative for photophobia, pain, redness and visual disturbance.  Respiratory: Negative for apnea, cough, choking, chest tightness, shortness of breath and wheezing.   Cardiovascular: Negative for chest pain, palpitations and leg swelling.  Gastrointestinal: Negative for nausea, vomiting, abdominal pain, diarrhea, constipation, blood in stool, abdominal distention, anal bleeding and rectal pain.  Genitourinary: Negative for dysuria, urgency, frequency, hematuria, flank  pain, decreased urine volume, discharge, penile swelling, scrotal swelling, difficulty urinating, genital sores and testicular pain.  Musculoskeletal: Positive for arthralgias. Negative for myalgias, back pain, joint swelling, gait problem, neck pain and neck stiffness.       He has knee pain and is scheduled to see orthopedics soon  Skin: Negative for color change, rash and wound.  Neurological: Negative for dizziness, tremors, seizures, syncope, facial asymmetry, speech  difficulty, weakness, light-headedness, numbness and headaches.  Hematological: Negative for adenopathy. Does not bruise/bleed easily.  Psychiatric/Behavioral: Negative for suicidal ideas, hallucinations, behavioral problems, confusion, sleep disturbance, self-injury, dysphoric mood, decreased concentration and agitation. The patient is not nervous/anxious.        Objective:   Physical Exam  Constitutional: He appears well-developed and well-nourished.  HENT:  Head: Normocephalic and atraumatic.  Right Ear: External ear normal.  Left Ear: External ear normal.  Nose: Nose normal.  Mouth/Throat: Oropharynx is clear and moist.  Eyes: Conjunctivae and EOM are normal. Pupils are equal, round, and reactive to light. No scleral icterus.  Neck: Normal range of motion. Neck supple. No JVD present. No thyromegaly present.  Cardiovascular: Regular rhythm, normal heart sounds and intact distal pulses.  Exam reveals no gallop and no friction rub.   No murmur heard. Pulmonary/Chest: Effort normal and breath sounds normal. He exhibits no tenderness.  Abdominal: Soft. Bowel sounds are normal. He exhibits no distension and no mass. There is no tenderness.  Genitourinary: Penis normal.  Musculoskeletal: Normal range of motion. He exhibits no edema or tenderness.  Lymphadenopathy:    He has no cervical adenopathy.  Neurological: He is alert. He has normal reflexes. No cranial nerve deficit. Coordination normal.  Skin: Skin is warm and dry. No rash noted.  Psychiatric: He has a normal mood and affect. His behavior is normal.          Assessment & Plan:   Preventive health examination Prostate cancer  Mild dyslipidemia  Modest weight loss encouraged. Regular exercise regimen encouraged as well as heart healthy diet Recheck one year Urology follow-up as scheduled

## 2015-02-16 NOTE — Patient Instructions (Signed)
It is important that you exercise regularly, at least 20 minutes 3 to 4 times per week.  If you develop chest pain or shortness of breath seek  medical attention.  Urology follow-up as scheduled  Return in one year for follow-up

## 2015-03-10 ENCOUNTER — Encounter: Payer: Self-pay | Admitting: Gastroenterology

## 2015-04-06 ENCOUNTER — Other Ambulatory Visit: Payer: Self-pay | Admitting: Internal Medicine

## 2015-04-13 ENCOUNTER — Other Ambulatory Visit: Payer: Self-pay | Admitting: Internal Medicine

## 2015-05-21 ENCOUNTER — Encounter: Payer: Self-pay | Admitting: Family Medicine

## 2015-05-21 ENCOUNTER — Ambulatory Visit (INDEPENDENT_AMBULATORY_CARE_PROVIDER_SITE_OTHER): Payer: PRIVATE HEALTH INSURANCE | Admitting: Family Medicine

## 2015-05-21 ENCOUNTER — Encounter: Payer: Self-pay | Admitting: *Deleted

## 2015-05-21 VITALS — BP 100/64 | HR 79 | Temp 98.8°F | Ht 68.75 in | Wt 176.0 lb

## 2015-05-21 DIAGNOSIS — J069 Acute upper respiratory infection, unspecified: Secondary | ICD-10-CM | POA: Diagnosis not present

## 2015-05-21 DIAGNOSIS — Z8709 Personal history of other diseases of the respiratory system: Secondary | ICD-10-CM | POA: Diagnosis not present

## 2015-05-21 MED ORDER — ALBUTEROL SULFATE HFA 108 (90 BASE) MCG/ACT IN AERS: 2.0000 | INHALATION_SPRAY | Freq: Four times a day (QID) | RESPIRATORY_TRACT | Status: DC | PRN

## 2015-05-21 MED ORDER — BENZONATATE 100 MG PO CAPS: 100.0000 mg | ORAL_CAPSULE | Freq: Three times a day (TID) | ORAL | Status: DC | PRN

## 2015-05-21 NOTE — Progress Notes (Signed)
Pre visit review using our clinic review tool, if applicable. No additional management support is needed unless otherwise documented below in the visit note. 

## 2015-05-21 NOTE — Patient Instructions (Signed)
BEFORE YOU LEAVE: -work note, seen today and may return to work  INSTRUCTIONS FOR UPPER RESPIRATORY INFECTION:  -use the albuterol per instructions as needed for cough or asthma symptoms; if asthma symptoms or difficulty breathing that do not resolve with albuterol seek care immediately  -nasal saline wash 2-3 times daily (use prepackaged nasal saline or bottled/distilled water if making your own)   -can use AFRIN nasal spray for drainage and nasal congestion - but do NOT use longer then 3-4 days  -can use tylenol (in no history of liver disease) or ibuprofen (if no history of kidney disease, bowel bleeding or significant heart disease) as directed for aches and sorethroat  -in the winter time, using a humidifier at night is helpful (please follow cleaning instructions)  -if you are taking a cough medication - use only as directed, may also try a teaspoon of honey to coat the throat and throat lozenges.  -for sore throat, salt water gargles can help  -follow up if you have fevers, facial pain, tooth pain, difficulty breathing or are worsening or symptoms persist longer then expected  Upper Respiratory Infection, Adult An upper respiratory infection (URI) is also known as the common cold. It is often caused by a type of germ (virus). Colds are easily spread (contagious). You can pass it to others by kissing, coughing, sneezing, or drinking out of the same glass. Usually, you get better in 1 to 3  weeks.  However, the cough can last for even longer. HOME CARE   Only take medicine as told by your doctor. Follow instructions provided above.  Drink enough water and fluids to keep your pee (urine) clear or pale yellow.  Get plenty of rest.  Return to work when your temperature is < 100 for 24 hours or as told by your doctor. You may use a face mask and wash your hands to stop your cold from spreading. GET HELP RIGHT AWAY IF:   After the first few days, you feel you are getting  worse.  You have questions about your medicine.  You have chills, shortness of breath, or red spit (mucus).  You have pain in the face for more then 1-2 days, especially when you bend forward.  You have a fever, puffy (swollen) neck, pain when you swallow, or white spots in the back of your throat.  You have a bad headache, ear pain, sinus pain, or chest pain.  You have a high-pitched whistling sound when you breathe in and out (wheezing).  You cough up blood.  You have sore muscles or a stiff neck. MAKE SURE YOU:   Understand these instructions.  Will watch your condition.  Will get help right away if you are not doing well or get worse. Document Released: 06/08/2007 Document Revised: 03/14/2011 Document Reviewed: 03/27/2013 [facility] Patient Information 2015 [place], [place]. This information is not intended to replace advice given to you by your health care provider. Make sure you discuss any questions you have with your health care provider.

## 2015-05-21 NOTE — Progress Notes (Signed)
HPI:  URI: -started: 2 days ago -symptoms:nasal congestion, tickle in throat, cough, occ chest discomfort only when coughs -denies:fever, SOB, NVD, tooth pain -has tried: nothing -sick contacts/travel/risks: no reported flu, strep or tick exposure - fiance with the same and recovering from "viral infection" -Hx of: allergies and ? Asthma, reports only had symptoms when sick, but used inhalers for some time in the past, reports has not used inhalers in a long time (4 years) ROS: See pertinent positives and negatives per HPI.  Past Medical History  Diagnosis Date  . 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 ANXIETY DEPRESSION 03/09/2009  . ARTHRALGIA 11/13/2008  . DEPRESSION 03/09/2009  . Dermatophytosis of groin and perianal area 05/16/2007  . GERD 09/13/2008  . HEMATURIA, HX OF 05/28/2009  . History of ETOH abuse   . Asthma     No past surgical history on file.  Family History  Problem Relation Age of Onset  . Diabetes Mother   . Hypertension Mother   . Cancer Mother     Social History   Social History  . Marital Status: Single    Spouse Name: N/A  . Number of Children: N/A  . Years of Education: N/A   Social History Main Topics  . Smoking status: Former Smoker -- 0.20 packs/day    Types: Cigarettes  . Smokeless tobacco: Former Systems developer  . Alcohol Use: No  . Drug Use: No  . Sexual Activity: Not Asked   Other Topics Concern  . None   Social History Narrative     Current outpatient prescriptions:  .  acyclovir (ZOVIRAX) 200 MG capsule, TAKE 5 CAPSULES DAILY X 3 DAYS FOR OUTBREAK AS NEEDED, Disp: 30 capsule, Rfl: 5 .  ibuprofen (ADVIL,MOTRIN) 200 MG tablet, Take 400 mg by mouth every 6 (six) hours as needed. pain, Disp: , Rfl:  .  meloxicam (MOBIC) 7.5 MG tablet, TAKE 1 TABLET (7.5 MG TOTAL) BY MOUTH DAILY., Disp: 90 tablet, Rfl: 3 .  albuterol (PROVENTIL HFA;VENTOLIN HFA) 108 (90 Base) MCG/ACT inhaler, Inhale 2 puffs into the lungs every 6 (six)  hours as needed., Disp: 1 Inhaler, Rfl: 0 .  benzonatate (TESSALON PERLES) 100 MG capsule, Take 1 capsule (100 mg total) by mouth 3 (three) times daily as needed., Disp: 20 capsule, Rfl: 0  EXAM:  Filed Vitals:   05/21/15 1050  BP: 100/64  Pulse: 79  Temp: 98.8 F (37.1 C)    Body mass index is 26.19 kg/(m^2).  GENERAL: vitals reviewed and listed above, alert, oriented, appears well hydrated and in no acute distress  HEENT: atraumatic, conjunttiva clear, no obvious abnormalities on inspection of external nose and ears, normal appearance of ear canals and TMs, clear nasal congestion, mild post oropharyngeal erythema with PND, no tonsillar edema or exudate, no sinus TTP  NECK: no obvious masses on inspection  LUNGS: clear to auscultation bilaterally, no wheezes, rales or rhonchi, good air movement  CV: HRRR, no peripheral edema  MS: moves all extremities without noticeable abnormality  PSYCH: pleasant and cooperative, no obvious depression or anxiety  ASSESSMENT AND PLAN:  Discussed the following assessment and plan:  Viral upper respiratory infection - Plan: benzonatate (TESSALON PERLES) 100 MG capsule  History of asthma - Plan: albuterol (PROVENTIL HFA;VENTOLIN HFA) 108 (90 Base) MCG/ACT inhaler  -given HPI and exam findings today, a serious infection or illness is unlikely. We discussed potential etiologies, with VURI being most likely, and advised supportive care and monitoring. Tessalon for cough. Also provided  albuterol and discuss proper use if needed and return precautions if any signs of asthma exacerbation. We discussed treatment side effects, likely course, antibiotic misuse, transmission, and signs of developing a serious illness. No work restrictions. -of course, we advised to return or notify a doctor immediately if symptoms worsen or persist or new concerns arise.    Patient Instructions  BEFORE YOU LEAVE: -work note, seen today and may return to  work  INSTRUCTIONS FOR UPPER RESPIRATORY INFECTION:  -use the albuterol per instructions as needed for cough or asthma symptoms; if asthma symptoms or difficulty breathing that do not resolve with albuterol seek care immediately  -nasal saline wash 2-3 times daily (use prepackaged nasal saline or bottled/distilled water if making your own)   -can use AFRIN nasal spray for drainage and nasal congestion - but do NOT use longer then 3-4 days  -can use tylenol (in no history of liver disease) or ibuprofen (if no history of kidney disease, bowel bleeding or significant heart disease) as directed for aches and sorethroat  -in the winter time, using a humidifier at night is helpful (please follow cleaning instructions)  -if you are taking a cough medication - use only as directed, may also try a teaspoon of honey to coat the throat and throat lozenges.  -for sore throat, salt water gargles can help  -follow up if you have fevers, facial pain, tooth pain, difficulty breathing or are worsening or symptoms persist longer then expected  Upper Respiratory Infection, Adult An upper respiratory infection (URI) is also known as the common cold. It is often caused by a type of germ (virus). Colds are easily spread (contagious). You can pass it to others by kissing, coughing, sneezing, or drinking out of the same glass. Usually, you get better in 1 to 3  weeks.  However, the cough can last for even longer. HOME CARE   Only take medicine as told by your doctor. Follow instructions provided above.  Drink enough water and fluids to keep your pee (urine) clear or pale yellow.  Get plenty of rest.  Return to work when your temperature is < 100 for 24 hours or as told by your doctor. You may use a face mask and wash your hands to stop your cold from spreading. GET HELP RIGHT AWAY IF:   After the first few days, you feel you are getting worse.  You have questions about your medicine.  You have chills,  shortness of breath, or red spit (mucus).  You have pain in the face for more then 1-2 days, especially when you bend forward.  You have a fever, puffy (swollen) neck, pain when you swallow, or white spots in the back of your throat.  You have a bad headache, ear pain, sinus pain, or chest pain.  You have a high-pitched whistling sound when you breathe in and out (wheezing).  You cough up blood.  You have sore muscles or a stiff neck. MAKE SURE YOU:   Understand these instructions.  Will watch your condition.  Will get help right away if you are not doing well or get worse. Document Released: 06/08/2007 Document Revised: 03/14/2011 Document Reviewed: 03/27/2013 [facility] Patient Information 2015 [place], [place]. This information is not intended to replace advice given to you by your health care provider. Make sure you discuss any questions you have with your health care provider.      Colin Benton R.

## 2015-09-21 ENCOUNTER — Ambulatory Visit (INDEPENDENT_AMBULATORY_CARE_PROVIDER_SITE_OTHER): Payer: PRIVATE HEALTH INSURANCE | Admitting: Internal Medicine

## 2015-09-21 ENCOUNTER — Encounter: Payer: Self-pay | Admitting: Internal Medicine

## 2015-09-21 VITALS — BP 130/90 | HR 67 | Temp 98.0°F | Resp 20 | Ht 68.75 in | Wt 182.0 lb

## 2015-09-21 DIAGNOSIS — Z0000 Encounter for general adult medical examination without abnormal findings: Secondary | ICD-10-CM

## 2015-09-21 DIAGNOSIS — J4521 Mild intermittent asthma with (acute) exacerbation: Secondary | ICD-10-CM | POA: Diagnosis not present

## 2015-09-21 DIAGNOSIS — Z113 Encounter for screening for infections with a predominantly sexual mode of transmission: Secondary | ICD-10-CM | POA: Diagnosis not present

## 2015-09-21 NOTE — Progress Notes (Signed)
Subjective:    Patient ID: Jose [name], male    DOB: 20-Feb-1960, 55 y.o.   MRN: [id number]  HPI  55 year old patient who is seen today for STD screening.  He states that he will be married this coming weekend (fourth marriage) and is requesting the STD treatment.  He states that he donated blood 2 or 3 years ago and was told that he had a positive RPR.  No prior history of treatment for syphilis.  He does have a history of genital herpes and he does take acyclovir periodically.  No symptoms of STD except for resolving herpetic lesion involving the penile shaft  Past Medical History:  Diagnosis Date  . 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 ANXIETY DEPRESSION 03/09/2009  . ARTHRALGIA 11/13/2008  . Asthma   . DEPRESSION 03/09/2009  . Dermatophytosis of groin and perianal area 05/16/2007  . GERD 09/13/2008  . HEMATURIA, HX OF 05/28/2009  . History of ETOH abuse      Social History   Social History  . Marital status: Single    Spouse name: N/A  . Number of children: N/A  . Years of education: N/A   Occupational History  . Not on file.   Social History Main Topics  . Smoking status: Former Smoker    Packs/day: 0.20    Types: Cigarettes  . Smokeless tobacco: Former Systems developer  . Alcohol use No  . Drug use: No  . Sexual activity: Not on file   Other Topics Concern  . Not on file   Social History Narrative  . No narrative on file    No past surgical history on file.  Family History  Problem Relation Age of Onset  . Diabetes Mother   . Hypertension Mother   . Cancer Mother     No Known Allergies  Current Outpatient Prescriptions on File Prior to Visit  Medication Sig Dispense Refill  . acyclovir (ZOVIRAX) 200 MG capsule TAKE 5 CAPSULES DAILY X 3 DAYS FOR OUTBREAK AS NEEDED 30 capsule 5  . albuterol (PROVENTIL HFA;VENTOLIN HFA) 108 (90 Base) MCG/ACT inhaler Inhale 2 puffs into the lungs every 6 (six) hours as needed. 1 Inhaler 0  . ibuprofen  (ADVIL,MOTRIN) 200 MG tablet Take 400 mg by mouth every 6 (six) hours as needed. pain    . meloxicam (MOBIC) 7.5 MG tablet TAKE 1 TABLET (7.5 MG TOTAL) BY MOUTH DAILY. 90 tablet 3   No current facility-administered medications on file prior to visit.     BP 130/90 (BP Location: Left Arm, Patient Position: Sitting, Cuff Size: Normal)   Pulse 67   Temp 98 F (36.7 C) (Oral)   Resp 20   Ht 5' 8.75" (1.746 m)   Wt 182 lb (82.6 kg)   SpO2 98%   BMI 27.07 kg/m     Review of Systems  Constitutional: Negative for appetite change, chills, fatigue and fever.  HENT: Negative for congestion, dental problem, ear pain, hearing loss, sore throat, tinnitus, trouble swallowing and voice change.   Eyes: Negative for pain, discharge and visual disturbance.  Respiratory: Negative for cough, chest tightness, wheezing and stridor.   Cardiovascular: Negative for chest pain, palpitations and leg swelling.  Gastrointestinal: Negative for abdominal distention, abdominal pain, blood in stool, constipation, diarrhea, nausea and vomiting.  Genitourinary: Negative for difficulty urinating, discharge, flank pain, genital sores, hematuria and urgency.  Musculoskeletal: Negative for arthralgias, back pain, gait problem, joint swelling, myalgias and neck stiffness.  Skin: Positive for rash.  Neurological: Negative for dizziness, syncope, speech difficulty, weakness, numbness and headaches.  Hematological: Negative for adenopathy. Does not bruise/bleed easily.  Psychiatric/Behavioral: Negative for behavioral problems and dysphoric mood. The patient is not nervous/anxious.        Objective:   Physical Exam  Constitutional: He appears well-developed and well-nourished. No distress.  Cardiovascular: Normal rate and regular rhythm.   Pulmonary/Chest: Effort normal and breath sounds normal.  Abdominal: Soft.  Genitourinary:  Genitourinary Comments: Tiny 1 mm healing ulceration involving the penile shaft.  No  inguinal adenopathy or other lesions.  No urethral discharge          Assessment & Plan:   STD screening.  Will check an RPR.  If this is positive, we'll check FTA-ABS.  Check HIV Mild asthma, stable  CPX 6 months  [name]

## 2015-09-21 NOTE — Patient Instructions (Signed)
Return in 6 months for your annual exam   

## 2015-09-21 NOTE — Progress Notes (Signed)
Pre visit review using our clinic review tool, if applicable. No additional management support is needed unless otherwise documented below in the visit note. 

## 2015-09-22 LAB — HIV ANTIBODY (ROUTINE TESTING W REFLEX): HIV 1&2 Ab, 4th Generation: NONREACTIVE

## 2015-09-22 LAB — RPR

## 2015-12-30 ENCOUNTER — Other Ambulatory Visit: Payer: Self-pay | Admitting: Internal Medicine

## 2016-02-15 ENCOUNTER — Other Ambulatory Visit (INDEPENDENT_AMBULATORY_CARE_PROVIDER_SITE_OTHER): Payer: 59

## 2016-02-15 DIAGNOSIS — Z0000 Encounter for general adult medical examination without abnormal findings: Secondary | ICD-10-CM

## 2016-02-15 LAB — CBC WITH DIFFERENTIAL/PLATELET
BASOS ABS: 0.1 10*3/uL (ref 0.0–0.1)
Basophils Relative: 1.2 % (ref 0.0–3.0)
Eosinophils Absolute: 0.3 10*3/uL (ref 0.0–0.7)
Eosinophils Relative: 4.6 % (ref 0.0–5.0)
HCT: 44.3 % (ref 39.0–52.0)
Hemoglobin: 14.9 g/dL (ref 13.0–17.0)
Lymphocytes Relative: 30.4 % (ref 12.0–46.0)
Lymphs Abs: 1.7 10*3/uL (ref 0.7–4.0)
MCHC: 33.6 g/dL (ref 30.0–36.0)
MCV: 91 fl (ref 78.0–100.0)
MONO ABS: 0.6 10*3/uL (ref 0.1–1.0)
MONOS PCT: 10.4 % (ref 3.0–12.0)
Neutro Abs: 3 10*3/uL (ref 1.4–7.7)
Neutrophils Relative %: 53.4 % (ref 43.0–77.0)
Platelets: 162 10*3/uL (ref 150.0–400.0)
RBC: 4.87 Mil/uL (ref 4.22–5.81)
RDW: 14.1 % (ref 11.5–15.5)
WBC: 5.5 10*3/uL (ref 4.0–10.5)

## 2016-02-15 LAB — BASIC METABOLIC PANEL
BUN: 12 mg/dL (ref 6–23)
CO2: 26 mEq/L (ref 19–32)
Calcium: 9.5 mg/dL (ref 8.4–10.5)
Chloride: 107 mEq/L (ref 96–112)
Creatinine, Ser: 0.97 mg/dL (ref 0.40–1.50)
GFR: 102.58 mL/min (ref >60.00)
Glucose, Bld: 105 mg/dL — ABNORMAL HIGH (ref 70–99)
Potassium: 4 mEq/L (ref 3.5–5.1)
Sodium: 139 mEq/L (ref 135–145)

## 2016-02-15 LAB — HEPATIC FUNCTION PANEL
ALK PHOS: 52 U/L (ref 39–117)
ALT: 27 U/L (ref 0–53)
AST: 16 U/L (ref 0–37)
Albumin: 4.2 g/dL (ref 3.5–5.2)
BILIRUBIN TOTAL: 0.4 mg/dL (ref 0.2–1.2)
Bilirubin, Direct: 0.1 mg/dL (ref 0.0–0.3)
Total Protein: 6.8 g/dL (ref 6.0–8.3)

## 2016-02-15 LAB — LIPID PANEL
Cholesterol: 209 mg/dL — ABNORMAL HIGH (ref 0–200)
HDL: 50.4 mg/dL (ref >39.00)
LDL CALC: 132 mg/dL — AB (ref 0–99)
NONHDL: 158.52
Total CHOL/HDL Ratio: 4
Triglycerides: 134 mg/dL (ref 0.0–149.0)
VLDL: 26.8 mg/dL (ref 0.0–40.0)

## 2016-02-15 LAB — POC URINALSYSI DIPSTICK (AUTOMATED)
GLUCOSE UA: NEGATIVE
Ketones, UA: NEGATIVE
Leukocytes, UA: NEGATIVE
Nitrite, UA: NEGATIVE
Protein, UA: NEGATIVE
RBC UA: NEGATIVE
Urobilinogen, UA: 0.2
pH, UA: 5.5

## 2016-02-15 LAB — TSH: TSH: 1.89 u[IU]/mL (ref 0.35–4.50)

## 2016-02-15 LAB — PSA: PSA: 7.79 ng/mL — ABNORMAL HIGH (ref 0.10–4.00)

## 2016-02-22 ENCOUNTER — Ambulatory Visit (INDEPENDENT_AMBULATORY_CARE_PROVIDER_SITE_OTHER): Payer: 59 | Admitting: Internal Medicine

## 2016-02-22 ENCOUNTER — Encounter: Payer: Self-pay | Admitting: Internal Medicine

## 2016-02-22 VITALS — BP 138/76 | HR 79 | Temp 98.4°F | Ht 69.0 in | Wt 183.0 lb

## 2016-02-22 DIAGNOSIS — Z0000 Encounter for general adult medical examination without abnormal findings: Secondary | ICD-10-CM | POA: Diagnosis not present

## 2016-02-22 NOTE — Progress Notes (Signed)
Pre visit review using our clinic review tool, if applicable. No additional management support is needed unless otherwise documented below in the visit note. 

## 2016-02-22 NOTE — Patient Instructions (Addendum)
It is important that you exercise regularly, at least 20 minutes 3 to 4 times per week.  If you develop chest pain or shortness of breath seek  medical attention.  Limit your sodium (Salt) intake  Health Maintenance, Male A healthy lifestyle and preventative care can promote health and wellness.  Maintain regular health, dental, and eye exams.  Eat a healthy diet. Foods like vegetables, fruits, whole grains, low-fat dairy products, and lean protein foods contain the nutrients you need and are low in calories. Decrease your intake of foods high in solid fats, added sugars, and salt. Get information about a proper diet from your health care provider, if necessary.  Regular physical exercise is one of the most important things you can do for your health. Most adults should get at least 150 minutes of moderate-intensity exercise (any activity that increases your heart rate and causes you to sweat) each week. In addition, most adults need muscle-strengthening exercises on 2 or more days a week.   Maintain a healthy weight. The body mass index (BMI) is a screening tool to identify possible weight problems. It provides an estimate of body fat based on height and weight. Your health care provider can find your BMI and can help you achieve or maintain a healthy weight. For males 20 years and older:  A BMI below 18.5 is considered underweight.  A BMI of 18.5 to 24.9 is normal.  A BMI of 25 to 29.9 is considered overweight.  A BMI of 30 and above is considered obese.  Maintain normal blood lipids and cholesterol by exercising and minimizing your intake of saturated fat. Eat a balanced diet with plenty of fruits and vegetables. Blood tests for lipids and cholesterol should begin at age 25 and be repeated every 5 years. If your lipid or cholesterol levels are high, you are over age 28, or you are at high risk for heart disease, you may need your cholesterol levels checked more frequently.Ongoing high  lipid and cholesterol levels should be treated with medicines if diet and exercise are not working.  If you smoke, find out from your health care provider how to quit. If you do not use tobacco, do not start.  Lung cancer screening is recommended for adults aged 55-80 years who are at high risk for developing lung cancer because of a history of smoking. A yearly low-dose CT scan of the lungs is recommended for people who have at least a 30-pack-year history of smoking and are current smokers or have quit within the past 15 years. A pack year of smoking is smoking an average of 1 pack of cigarettes a day for 1 year (for example, a 30-pack-year history of smoking could mean smoking 1 pack a day for 30 years or 2 packs a day for 15 years). Yearly screening should continue until the smoker has stopped smoking for at least 15 years. Yearly screening should be stopped for people who develop a health problem that would prevent them from having lung cancer treatment.  If you choose to drink alcohol, do not have more than 2 drinks per day. One drink is considered to be 12 oz (360 mL) of beer, 5 oz (150 mL) of wine, or 1.5 oz (45 mL) of liquor.  Avoid the use of street drugs. Do not share needles with anyone. Ask for help if you need support or instructions about stopping the use of drugs.  High blood pressure causes heart disease and increases the risk of stroke. High  blood pressure is more likely to develop in:  People who have blood pressure in the end of the normal range (100-139/85-89 mm Hg).  People who are overweight or obese.  People who are African American.  If you are 24-10 years of age, have your blood pressure checked every 3-5 years. If you are 98 years of age or older, have your blood pressure checked every year. You should have your blood pressure measured twice-once when you are at a hospital or clinic, and once when you are not at a hospital or clinic. Record the average of the two  measurements. To check your blood pressure when you are not at a hospital or clinic, you can use:  An automated blood pressure machine at a pharmacy.  A home blood pressure monitor.  If you are 87-35 years old, ask your health care provider if you should take aspirin to prevent heart disease.  Diabetes screening involves taking a blood sample to check your fasting blood sugar level. This should be done once every 3 years after age 71 if you are at a normal weight and without risk factors for diabetes. Testing should be considered at a younger age or be carried out more frequently if you are overweight and have at least 1 risk factor for diabetes.  Colorectal cancer can be detected and often prevented. Most routine colorectal cancer screening begins at the age of 54 and continues through age 16. However, your health care provider may recommend screening at an earlier age if you have risk factors for colon cancer. On a yearly basis, your health care provider may provide home test kits to check for hidden blood in the stool. A small camera at the end of a tube may be used to directly examine the colon (sigmoidoscopy or colonoscopy) to detect the earliest forms of colorectal cancer. Talk to your health care provider about this at age 61 when routine screening begins. A direct exam of the colon should be repeated every 5-10 years through age 47, unless early forms of precancerous polyps or small growths are found.  People who are at an increased risk for hepatitis B should be screened for this virus. You are considered at high risk for hepatitis B if:  You were born in a country where hepatitis B occurs often. Talk with your health care provider about which countries are considered high risk.  Your parents were born in a high-risk country and you have not received a shot to protect against hepatitis B (hepatitis B vaccine).  You have HIV or AIDS.  You use needles to inject street drugs.  You live  with, or have sex with, someone who has hepatitis B.  You are a man who has sex with other men (MSM).  You get hemodialysis treatment.  You take certain medicines for conditions like cancer, organ transplantation, and autoimmune conditions.  Hepatitis C blood testing is recommended for all people born from [birth year] through 1966 and any individual with known risk factors for hepatitis C.  Healthy men should no longer receive prostate-specific antigen (PSA) blood tests as part of routine cancer screening. Talk to your health care provider about prostate cancer screening.  Testicular cancer screening is not recommended for adolescents or adult males who have no symptoms. Screening includes self-exam, a health care provider exam, and other screening tests. Consult with your health care provider about any symptoms you have or any concerns you have about testicular cancer.  Practice safe sex. Use condoms and  avoid high-risk sexual practices to reduce the spread of sexually transmitted infections (STIs).  You should be screened for STIs, including gonorrhea and chlamydia if:  You are sexually active and are younger than 24 years.  You are older than 24 years, and your health care provider tells you that you are at risk for this type of infection.  Your sexual activity has changed since you were last screened, and you are at an increased risk for chlamydia or gonorrhea. Ask your health care provider if you are at risk.  If you are at risk of being infected with HIV, it is recommended that you take a prescription medicine daily to prevent HIV infection. This is called pre-exposure prophylaxis (PrEP). You are considered at risk if:  You are a man who has sex with other men (MSM).  You are a heterosexual man who is sexually active with multiple partners.  You take drugs by injection.  You are sexually active with a partner who has HIV.  Talk with your health care provider about whether you are at  high risk of being infected with HIV. If you choose to begin PrEP, you should first be tested for HIV. You should then be tested every 3 months for as long as you are taking PrEP.  Use sunscreen. Apply sunscreen liberally and repeatedly throughout the day. You should seek shade when your shadow is shorter than you. Protect yourself by wearing long sleeves, pants, a wide-brimmed hat, and sunglasses year round whenever you are outdoors.  Tell your health care provider of new moles or changes in moles, especially if there is a change in shape or color. Also, tell your health care provider if a mole is larger than the size of a pencil eraser.  A one-time screening for abdominal aortic aneurysm (AAA) and surgical repair of large AAAs by ultrasound is recommended for men aged 80-75 years who are current or former smokers.  Stay current with your vaccines (immunizations). This information is not intended to replace advice given to you by your health care provider. Make sure you discuss any questions you have with your health care provider. Document Released: 06/18/2007 Document Revised: 01/10/2014 Document Reviewed: 09/23/2014 Elsevier Interactive Patient Education  2017 [employer].

## 2016-02-22 NOTE — Progress Notes (Signed)
Subjective:    Patient ID: Jose [name], male    DOB: 06-Jan-1960, 56 y.o.   MRN: [id number]  HPI 56 year old patient who is seen today for an annual preventive health examination. He has done quite well.  No major concerns or complaints except for some recent mid back pain. 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  Mother had a history of diabetes, end-stage renal disease and hypertension.  Father died of complications of lung cancer 4 sisters.  History of hypertension  Past Medical History:  Diagnosis Date  . 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 ANXIETY DEPRESSION 03/09/2009  . ARTHRALGIA 11/13/2008  . Asthma   . DEPRESSION 03/09/2009  . Dermatophytosis of groin and perianal area 05/16/2007  . GERD 09/13/2008  . HEMATURIA, HX OF 05/28/2009  . History of ETOH abuse      Social History   Social History  . Marital status: Single    Spouse name: N/A  . Number of children: N/A  . Years of education: N/A   Occupational History  . Not on file.   Social History Main Topics  . Smoking status: Former Smoker    Packs/day: 0.20    Types: Cigarettes  . Smokeless tobacco: Former Systems developer  . Alcohol use No  . Drug use: No  . Sexual activity: Not on file   Other Topics Concern  . Not on file   Social History Narrative  . No narrative on file    No past surgical history on file.  Family History  Problem Relation Age of Onset  . Diabetes Mother   . Hypertension Mother   . Cancer Mother     No Known Allergies  Current Outpatient Prescriptions on File Prior to Visit  Medication Sig Dispense Refill  . acyclovir (ZOVIRAX) 200 MG capsule TAKE 5 CAPSULES DAILY X 3 DAYS FOR OUTBREAK AS NEEDED 30 capsule 5  . ibuprofen (ADVIL,MOTRIN) 200 MG tablet Take 400 mg by mouth every 6 (six) hours as needed. pain    . meloxicam (MOBIC) 7.5 MG tablet TAKE 1  TABLET (7.5 MG TOTAL) BY MOUTH DAILY. 90 tablet 3   No current facility-administered medications on file prior to visit.     BP 138/76 (BP Location: Left Arm, Patient Position: Sitting, Cuff Size: Normal)   Pulse 79   Temp 98.4 F (36.9 C) (Oral)   Ht 5\' 9"  (1.753 m)   Wt 183 lb (83 kg)   SpO2 98%   BMI 27.02 kg/m      Review of Systems  Constitutional: Negative for appetite change, chills, fatigue and fever.  HENT: Negative for congestion, dental problem, ear pain, hearing loss, sore throat, tinnitus, trouble swallowing and voice change.   Eyes: Negative for pain, discharge and visual disturbance.  Respiratory: Negative for cough, chest tightness, wheezing and stridor.   Cardiovascular: Negative for chest pain, palpitations and leg swelling.  Gastrointestinal: Negative for abdominal distention, abdominal pain, blood in stool, constipation, diarrhea, nausea and vomiting.  Genitourinary: Negative for difficulty urinating, discharge, flank pain, genital sores, hematuria and urgency.  Musculoskeletal: Positive for back pain. Negative for arthralgias, gait problem, joint swelling, myalgias and neck stiffness.  Skin: Negative for rash.  Neurological: Negative for dizziness, syncope, speech difficulty, weakness, numbness and headaches.  Hematological: Negative for adenopathy. Does not bruise/bleed easily.  Psychiatric/Behavioral: Negative for behavioral problems and dysphoric  mood. The patient is not nervous/anxious.        Objective:   Physical Exam  Constitutional: He appears well-developed and well-nourished.  HENT:  Head: Normocephalic and atraumatic.  Right Ear: External ear normal.  Left Ear: External ear normal.  Nose: Nose normal.  Mouth/Throat: Oropharynx is clear and moist.  Eyes: Conjunctivae and EOM are normal. Pupils are equal, round, and reactive to light. No scleral icterus.  Neck: Normal range of motion. Neck supple. No JVD present. No thyromegaly present.    Cardiovascular: Regular rhythm, normal heart sounds and intact distal pulses.  Exam reveals no gallop and no friction rub.   No murmur heard. Pulmonary/Chest: Effort normal and breath sounds normal. He exhibits no tenderness.  Abdominal: Soft. Bowel sounds are normal. He exhibits no distension and no mass. There is no tenderness.  Genitourinary: Prostate normal and penis normal.  Musculoskeletal: Normal range of motion. He exhibits no edema or tenderness.  Lymphadenopathy:    He has no cervical adenopathy.  Neurological: He is alert. He has normal reflexes. No cranial nerve deficit. Coordination normal.  Skin: Skin is warm and dry. No rash noted.  Psychiatric: He has a normal mood and affect. His behavior is normal.       Preventive health examination  Lifestyle issues addressed.  Patient will attempt a more vigorous lifestyle. May have early impaired glucose tolerance.  He does have a family history of diabetes.  Will attempt to maintain ideal body weight and intensify his exercise regimen  [name]

## 2016-03-21 ENCOUNTER — Ambulatory Visit: Payer: Self-pay | Admitting: Podiatry

## 2016-03-21 ENCOUNTER — Ambulatory Visit (INDEPENDENT_AMBULATORY_CARE_PROVIDER_SITE_OTHER): Payer: 59 | Admitting: Podiatry

## 2016-03-21 ENCOUNTER — Ambulatory Visit (INDEPENDENT_AMBULATORY_CARE_PROVIDER_SITE_OTHER): Payer: 59

## 2016-03-21 ENCOUNTER — Encounter: Payer: Self-pay | Admitting: Podiatry

## 2016-03-21 VITALS — Resp 16 | Ht 68.0 in | Wt 179.0 lb

## 2016-03-21 DIAGNOSIS — B070 Plantar wart: Secondary | ICD-10-CM | POA: Diagnosis not present

## 2016-03-21 DIAGNOSIS — M722 Plantar fascial fibromatosis: Secondary | ICD-10-CM | POA: Diagnosis not present

## 2016-03-21 MED ORDER — TRIAMCINOLONE ACETONIDE 10 MG/ML IJ SUSP
10.0000 mg | Freq: Once | INTRAMUSCULAR | Status: AC
  Administered 2016-03-21: 10 mg

## 2016-03-21 MED ORDER — DICLOFENAC SODIUM 75 MG PO TBEC: 75.0000 mg | DELAYED_RELEASE_TABLET | Freq: Two times a day (BID) | ORAL | 2 refills | Status: DC

## 2016-03-21 NOTE — Progress Notes (Signed)
   Subjective:    Patient ID: Jose [name], male    DOB: 10/19/1960, 56 y.o.   MRN: [id number]  HPI  Chief Complaint  Patient presents with  . Foot Pain    Right; Bottom of heel x  "been going on for awhile". Pt stated, "when I put pressure on it, it causes pain"        Review of Systems     Objective:   Physical Exam        Assessment & Plan:

## 2016-03-21 NOTE — Patient Instructions (Signed)

## 2016-03-23 NOTE — Progress Notes (Signed)
Subjective:     Patient ID: Jose [name], male   DOB: 01/24/1960, 56 y.o.   MRN: [id number]  HPI patient presents stating I'm having pain in my right heel and I also have bunion formation. States the heel has been hurting for several months and getting worse and making it hard for him to work or be on his foot   Review of Systems  All other systems reviewed and are negative.      Objective:   Physical Exam  Constitutional: He is oriented to person, place, and time.  Cardiovascular: Intact distal pulses.   Musculoskeletal: Normal range of motion.  Neurological: He is oriented to person, place, and time.  Skin: Skin is warm.  Nursing note and vitals reviewed.  neurovascular status intact muscle strength adequate range of motion within normal limits with patient found to have exquisite tenderness in the plantar aspect of the right heel at the insertional point of the tendon into the calcaneus with fluid buildup around the medial band. There is also depression of the arch and structural bunion deformity bilateral which is not symptomatic currently     Assessment:     Plantar fasciitis right with structural bunion deformity and flatfoot deformity    Plan:     H&P conditions reviewed and we'll focus on the fascia. I injected 3 mg Kenalog 5 mill grams Xylocaine and applied fascial brace to lift the arch instructed on physical therapy supportive shoes and reappoint for [place] to recheck again in the next several weeks. Placed on diclofenac reviewed bunions and do not recommend correction currently  X-ray report indicated depression of the arch spur formation with moderate bunion deformity bilateral

## 2016-04-04 ENCOUNTER — Ambulatory Visit: Payer: 59 | Admitting: Podiatry

## 2016-05-02 ENCOUNTER — Ambulatory Visit (INDEPENDENT_AMBULATORY_CARE_PROVIDER_SITE_OTHER): Payer: 59 | Admitting: Internal Medicine

## 2016-05-02 ENCOUNTER — Encounter: Payer: Self-pay | Admitting: Internal Medicine

## 2016-05-02 VITALS — BP 100/62 | HR 96 | Temp 98.8°F | Ht 68.0 in | Wt 182.4 lb

## 2016-05-02 DIAGNOSIS — B9789 Other viral agents as the cause of diseases classified elsewhere: Secondary | ICD-10-CM

## 2016-05-02 DIAGNOSIS — J069 Acute upper respiratory infection, unspecified: Secondary | ICD-10-CM | POA: Diagnosis not present

## 2016-05-02 MED ORDER — HYDROCODONE-HOMATROPINE 5-1.5 MG/5ML PO SYRP: 5.0000 mL | ORAL_SOLUTION | Freq: Four times a day (QID) | ORAL | 0 refills | Status: DC | PRN

## 2016-05-02 NOTE — Progress Notes (Signed)
Subjective:    Patient ID: Jose [name], male    DOB: 1960/04/09, 56 y.o.   MRN: [id number]  HPI  56 year old patient who presents with a five-day history of mild sore throat, cough, congestion, rhinorrhea.  Denies any fever. He has been out of work for 3 days due to acute illness.  Cough is nonproductive.  He does have a history of asthma but has had no wheezing. He has tried a number of OTC medications without benefit  Past Medical History:  Diagnosis Date  . 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 ANXIETY DEPRESSION 03/09/2009  . ARTHRALGIA 11/13/2008  . Asthma   . DEPRESSION 03/09/2009  . Dermatophytosis of groin and perianal area 05/16/2007  . GERD 09/13/2008  . HEMATURIA, HX OF 05/28/2009  . History of ETOH abuse      Social History   Social History  . Marital status: Single    Spouse name: N/A  . Number of children: N/A  . Years of education: N/A   Occupational History  . Not on file.   Social History Main Topics  . Smoking status: Former Smoker    Packs/day: 0.20    Types: Cigarettes  . Smokeless tobacco: Former Systems developer  . Alcohol use No  . Drug use: No  . Sexual activity: Not on file   Other Topics Concern  . Not on file   Social History Narrative  . No narrative on file    No past surgical history on file.  Family History  Problem Relation Age of Onset  . Diabetes Mother   . Hypertension Mother   . Cancer Mother     No Known Allergies  Current Outpatient Prescriptions on File Prior to Visit  Medication Sig Dispense Refill  . acyclovir (ZOVIRAX) 200 MG capsule TAKE 5 CAPSULES DAILY X 3 DAYS FOR OUTBREAK AS NEEDED 30 capsule 5  . diclofenac (VOLTAREN) 75 MG EC tablet Take 1 tablet (75 mg total) by mouth 2 (two) times daily. 50 tablet 2  . ibuprofen (ADVIL,MOTRIN) 200 MG tablet Take 400 mg by mouth every 6 (six) hours as needed. pain    . meloxicam (MOBIC) 7.5 MG tablet TAKE 1 TABLET (7.5 MG TOTAL) BY MOUTH DAILY. 90 tablet 3    No current facility-administered medications on file prior to visit.     BP 100/62 (BP Location: Left Arm, Patient Position: Sitting, Cuff Size: Normal)   Pulse 96   Temp 98.8 F (37.1 C) (Oral)   Ht 5\' 8"  (1.727 m)   Wt 182 lb 6.4 oz (82.7 kg)   SpO2 98%   BMI 27.73 kg/m     Review of Systems  Constitutional: Positive for activity change, appetite change and fatigue. Negative for chills and fever.  HENT: Positive for congestion, postnasal drip and rhinorrhea. Negative for dental problem, ear pain, hearing loss, sore throat, tinnitus, trouble swallowing and voice change.   Eyes: Negative for pain, discharge and visual disturbance.  Respiratory: Positive for cough. Negative for chest tightness, wheezing and stridor.   Cardiovascular: Negative for chest pain, palpitations and leg swelling.  Gastrointestinal: Negative for abdominal distention, abdominal pain, blood in stool, constipation, diarrhea, nausea and vomiting.  Genitourinary: Negative for difficulty urinating, discharge, flank pain, genital sores, hematuria and urgency.  Musculoskeletal: Negative for arthralgias, back pain, gait problem, joint swelling, myalgias and neck stiffness.  Skin: Negative for rash.  Neurological: Negative for dizziness, syncope, speech difficulty, weakness, numbness and headaches.  Hematological: Negative for adenopathy. Does not  bruise/bleed easily.  Psychiatric/Behavioral: Negative for behavioral problems and dysphoric mood. The patient is not nervous/anxious.        Objective:   Physical Exam  Constitutional: He is oriented to person, place, and time. He appears well-developed. No distress.  Afebrile No distress Blood pressure low normal  Temperature 98.8 O2 saturation 98%  HENT:  Head: Normocephalic.  Right Ear: External ear normal.  Left Ear: External ear normal.  Eyes: Conjunctivae and EOM are normal.  Neck: Normal range of motion.  Cardiovascular: Normal rate and normal heart  sounds.   Pulmonary/Chest: Breath sounds normal. No respiratory distress. He has no wheezes. He has no rales.  Abdominal: Bowel sounds are normal.  Musculoskeletal: Normal range of motion. He exhibits no edema or tenderness.  Neurological: He is alert and oriented to person, place, and time.  Psychiatric: He has a normal mood and affect. His behavior is normal.          Assessment & Plan:   Viral URI with cough. Will treat symptomatically  [name]

## 2016-05-02 NOTE — Patient Instructions (Addendum)

## 2016-05-02 NOTE — Progress Notes (Signed)
Pre visit review using our clinic review tool, if applicable. No additional management support is needed unless otherwise documented below in the visit note. 

## 2016-06-01 ENCOUNTER — Ambulatory Visit: Payer: 59 | Admitting: Internal Medicine

## 2016-06-14 ENCOUNTER — Other Ambulatory Visit: Payer: Self-pay | Admitting: Internal Medicine

## 2016-06-15 ENCOUNTER — Encounter: Payer: Self-pay | Admitting: Family Medicine

## 2016-06-15 ENCOUNTER — Ambulatory Visit (INDEPENDENT_AMBULATORY_CARE_PROVIDER_SITE_OTHER): Payer: 59 | Admitting: Family Medicine

## 2016-06-15 VITALS — BP 124/70 | HR 59 | Temp 98.4°F | Ht 68.0 in | Wt 184.6 lb

## 2016-06-15 DIAGNOSIS — M5416 Radiculopathy, lumbar region: Secondary | ICD-10-CM | POA: Diagnosis not present

## 2016-06-15 DIAGNOSIS — R739 Hyperglycemia, unspecified: Secondary | ICD-10-CM

## 2016-06-15 MED ORDER — PREDNISONE 20 MG PO TABS: ORAL_TABLET | ORAL | 0 refills | Status: DC

## 2016-06-15 NOTE — Progress Notes (Signed)
Subjective:  Jose [name] is a 56 y.o. year old very pleasant male patient who presents for/with See problem oriented charting ROS- some paresthesias, burning pain into leg. No weakness. No saddle anesthesia or incontinence fecal or urinary   Past Medical History-  Patient Active Problem List   Diagnosis Date Noted  . Asthma with acute exacerbation 04/02/2013  . Prostate cancer ([place]) 07/21/2010  . ACQUIRED COAGULATION FACTOR DEFICIENCY 05/28/2009  . HEMATURIA, HX OF 05/28/2009  . ANXIETY DEPRESSION 03/09/2009  . DEPRESSION 03/09/2009  . ARTHRALGIA 11/13/2008  . GERD 09/13/2008  . DERMATOPHYTOSIS OF GROIN AND PERIANAL AREA 05/16/2007    Medications- reviewed and updated Current Outpatient Prescriptions  Medication Sig Dispense Refill  . acyclovir (ZOVIRAX) 200 MG capsule TAKE 5 CAPSULES DAILY X 3 DAYS FOR OUTBREAK AS NEEDED 30 capsule 5  . ibuprofen (ADVIL,MOTRIN) 200 MG tablet Take 400 mg by mouth every 6 (six) hours as needed. pain    . meloxicam (MOBIC) 7.5 MG tablet TAKE 1 TABLET (7.5 MG TOTAL) BY MOUTH DAILY. 90 tablet 0  . diclofenac (VOLTAREN) 75 MG EC tablet Take 1 tablet (75 mg total) by mouth 2 (two) times daily. (Patient not taking: Reported on 06/15/2016) 50 tablet 2  . predniSONE (DELTASONE) 20 MG tablet Take 2 pills for 3 days, 1 pill for 4 days 10 tablet 0   No current facility-administered medications for this visit.     Objective: BP 124/70 (BP Location: Left Arm, Patient Position: Sitting, Cuff Size: Large)   Pulse (!) 59   Temp 98.4 F (36.9 C) (Oral)   Ht 5\' 8"  (1.727 m)   Wt 184 lb 9.6 oz (83.7 kg)   SpO2 97%   BMI 28.07 kg/m  Gen: NAD, resting comfortably CV: RRR no murmurs rubs or gallops Lungs: CTAB no crackles, wheeze, rhonchi Abdomen: soft/nontender/nondistended/normal bowel sounds. No rebound or guarding.  Ext: no edema Skin: warm, dry  Back - Normal skin, Spine with normal alignment and no deformity.  No tenderness to vertebral process  palpation.  Paraspinous muscles are not tender and without spasm.   Range of motion is full at neck and lumbar sacral regions. Positive Straight leg raise.  Neuro- no saddle anesthesia, 5/5 strength lower extremities  Assessment/Plan:  Right lumbar radiculopathy S: right leg pain from foot down to the hip. Has tried tylenol, 600mg  ibuprofen, meloxicam x2, icing. Pain started about a week ago. Steadily worsening. No falls or injury. Walks a lot at work. Walking actually makes it better at first but then worsens. Worse with laying. Has noted some swelling in the knee. Able to sleep. Patient moderate burning- kept him from working yesterday and today- does want to try going back tomorrow A/P: Patient Instructions  Suspect leg pain may be coming from the back and a pinched nerve  Trial 7 day course of prednisone  Glad you have follow up next week with Dr. Mayer Camel if not improving. See [place] back sooner for new or worsening symptoms particularly the ones we discussed like leg weakness, incontinence.   We discussed prior cbg of 105 and needing to watch weight gain, food intake while on prednisone  Meds ordered this encounter  Medications  . predniSONE (DELTASONE) 20 MG tablet    Sig: Take 2 pills for 3 days, 1 pill for 4 days    Dispense:  10 tablet    Refill:  0    Return precautions advised.  Garret Reddish, MD

## 2016-06-15 NOTE — Patient Instructions (Signed)
Suspect leg pain may be coming from the back and a pinched nerve  Trial 7 day course of prednisone  Glad you have follow up next week with Dr. Mayer Camel if not improving. See [place] back sooner for new or worsening symptoms particularly the ones we discussed like leg weakness, incontinence.

## 2016-08-25 ENCOUNTER — Other Ambulatory Visit: Payer: Self-pay | Admitting: Internal Medicine

## 2016-08-30 ENCOUNTER — Ambulatory Visit (INDEPENDENT_AMBULATORY_CARE_PROVIDER_SITE_OTHER): Payer: 59 | Admitting: Internal Medicine

## 2016-08-30 ENCOUNTER — Encounter: Payer: Self-pay | Admitting: Internal Medicine

## 2016-08-30 VITALS — BP 112/70 | HR 78 | Temp 97.6°F | Ht 68.0 in | Wt 187.2 lb

## 2016-08-30 DIAGNOSIS — K219 Gastro-esophageal reflux disease without esophagitis: Secondary | ICD-10-CM

## 2016-08-30 NOTE — Patient Instructions (Addendum)
Follow-up ophthalmology  Avoids foods high in acid such as tomatoes citrus juices, and spicy foods.  Avoid eating within two hours of lying down or before exercising.  Do not overheat.  Try smaller more frequent meals.  If symptoms persist,  Okay to take Protonix once daily  Return in 6 months for follow-up    It is important that you exercise regularly, at least 20 minutes 3 to 4 times per week.  If you develop chest pain or shortness of breath seek  medical attention.  You need to lose weight.  Consider a lower calorie diet and regular exercise.

## 2016-08-30 NOTE — Progress Notes (Signed)
Subjective:    Patient ID: Jose [name], male    DOB: [birth year], 56 y.o.   MRN: [id number]  HPI  56 year old patient who presents with a chief complaint of a small papule involving the right upper outer eyelid present for about 8 months. For the past couple days he has had some increase in indigestion. He admits to overeating with 3 large meals daily and some recent weight gain. Symptoms have resolved with Protonix. He is followed by Dr. Idolina Primer. He states that the small papule involving his right upper outer lid interferes with use of his contacts . Past Medical History:  Diagnosis Date  . 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 ANXIETY DEPRESSION 03/09/2009  . ARTHRALGIA 11/13/2008  . Asthma   . DEPRESSION 03/09/2009  . Dermatophytosis of groin and perianal area 05/16/2007  . GERD 09/13/2008  . HEMATURIA, HX OF 05/28/2009  . History of ETOH abuse      Social History   Social History  . Marital status: Single    Spouse name: N/A  . Number of children: N/A  . Years of education: N/A   Occupational History  . Not on file.   Social History Main Topics  . Smoking status: Former Smoker    Packs/day: 0.20    Types: Cigarettes  . Smokeless tobacco: Former Systems developer  . Alcohol use No  . Drug use: No  . Sexual activity: Not on file   Other Topics Concern  . Not on file   Social History Narrative  . No narrative on file    No past surgical history on file.  Family History  Problem Relation Age of Onset  . Diabetes Mother   . Hypertension Mother   . Cancer Mother     No Known Allergies  Current Outpatient Prescriptions on File Prior to Visit  Medication Sig Dispense Refill  . acyclovir (ZOVIRAX) 200 MG capsule TAKE 5 CAPSULES DAILY X 3 DAYS FOR OUTBREAK AS NEEDED 30 capsule 5  . ibuprofen (ADVIL,MOTRIN) 200 MG tablet Take 400 mg by mouth every 6 (six) hours as needed. pain    . meloxicam (MOBIC) 7.5 MG tablet TAKE 1 TABLET (7.5 MG TOTAL) BY MOUTH  DAILY. 90 tablet 0   No current facility-administered medications on file prior to visit.     BP 112/70 (BP Location: Left Arm, Patient Position: Sitting, Cuff Size: Normal)   Pulse 78   Temp 97.6 F (36.4 C) (Oral)   Ht 5\' 8"  (1.727 m)   Wt 187 lb 3.2 oz (84.9 kg)   SpO2 98%   BMI 28.46 kg/m     Review of Systems  Constitutional: Negative for appetite change, chills, fatigue and fever.  HENT: Negative for congestion, dental problem, ear pain, hearing loss, sore throat, tinnitus, trouble swallowing and voice change.   Eyes: Positive for redness. Negative for pain, discharge and visual disturbance.  Respiratory: Negative for cough, chest tightness, wheezing and stridor.   Cardiovascular: Negative for chest pain, palpitations and leg swelling.  Gastrointestinal: Negative for abdominal distention, abdominal pain, blood in stool, constipation, diarrhea, nausea and vomiting.  Genitourinary: Negative for difficulty urinating, discharge, flank pain, genital sores, hematuria and urgency.  Musculoskeletal: Negative for arthralgias, back pain, gait problem, joint swelling, myalgias and neck stiffness.  Skin: Negative for rash.  Neurological: Negative for dizziness, syncope, speech difficulty, weakness, numbness and headaches.  Hematological: Negative for adenopathy. Does not bruise/bleed easily.  Psychiatric/Behavioral: Negative for behavioral problems and dysphoric mood. The patient is  not nervous/anxious.        Objective:   Physical Exam  Constitutional: He is oriented to person, place, and time. He appears well-developed.  HENT:  Head: Normocephalic.  Right Ear: External ear normal.  Left Ear: External ear normal.  Eyes: Conjunctivae and EOM are normal.  Small 3-4 mm papule right upper outer lid  Neck: Normal range of motion.  Cardiovascular: Normal rate and normal heart sounds.   Pulmonary/Chest: Breath sounds normal. No respiratory distress. He has no wheezes.  Abdominal:  Bowel sounds are normal. He exhibits no distension. There is no tenderness.  Musculoskeletal: Normal range of motion. He exhibits no edema or tenderness.  Neurological: He is alert and oriented to person, place, and time.  Psychiatric: He has a normal mood and affect. His behavior is normal.          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  Patient will follow-up with his ophthalmologist GERD flare.  Diet weight loss encouraged.  Will continue short-term use of Protonix History of asthma, stable  CPX 6 months  Darrek Leasure Pilar Plate

## 2016-09-09 ENCOUNTER — Other Ambulatory Visit: Payer: Self-pay | Admitting: Internal Medicine

## 2016-09-15 ENCOUNTER — Emergency Department (HOSPITAL_BASED_OUTPATIENT_CLINIC_OR_DEPARTMENT_OTHER)
Admission: EM | Admit: 2016-09-15 | Discharge: 2016-09-15 | Disposition: A | Discharge status: Home / Self Care | Attending: Emergency Medicine | Admitting: Emergency Medicine

## 2016-09-15 ENCOUNTER — Encounter (HOSPITAL_BASED_OUTPATIENT_CLINIC_OR_DEPARTMENT_OTHER): Payer: Self-pay

## 2016-09-15 DIAGNOSIS — S3992XA Unspecified injury of lower back, initial encounter: Secondary | ICD-10-CM | POA: Diagnosis present

## 2016-09-15 DIAGNOSIS — J45909 Unspecified asthma, uncomplicated: Secondary | ICD-10-CM | POA: Diagnosis not present

## 2016-09-15 DIAGNOSIS — Y939 Activity, unspecified: Secondary | ICD-10-CM | POA: Diagnosis not present

## 2016-09-15 DIAGNOSIS — Z8546 Personal history of malignant neoplasm of prostate: Secondary | ICD-10-CM | POA: Diagnosis not present

## 2016-09-15 DIAGNOSIS — Y929 Unspecified place or not applicable: Secondary | ICD-10-CM | POA: Diagnosis not present

## 2016-09-15 DIAGNOSIS — Z87891 Personal history of nicotine dependence: Secondary | ICD-10-CM | POA: Diagnosis not present

## 2016-09-15 DIAGNOSIS — Y999 Unspecified external cause status: Secondary | ICD-10-CM | POA: Insufficient documentation

## 2016-09-15 DIAGNOSIS — X509XXA Other and unspecified overexertion or strenuous movements or postures, initial encounter: Secondary | ICD-10-CM | POA: Diagnosis not present

## 2016-09-15 DIAGNOSIS — M5417 Radiculopathy, lumbosacral region: Secondary | ICD-10-CM | POA: Diagnosis not present

## 2016-09-15 MED ORDER — KETOROLAC TROMETHAMINE 60 MG/2ML IM SOLN
30.0000 mg | Freq: Once | INTRAMUSCULAR | Status: AC
  Administered 2016-09-15: 30 mg via INTRAMUSCULAR
  Filled 2016-09-15: qty 2

## 2016-09-15 MED ORDER — PREDNISONE 10 MG PO TABS: 50.0000 mg | ORAL_TABLET | Freq: Every day | ORAL | 0 refills | Status: DC

## 2016-09-15 MED ORDER — CYCLOBENZAPRINE HCL 10 MG PO TABS: 10.0000 mg | ORAL_TABLET | Freq: Two times a day (BID) | ORAL | 0 refills | Status: DC | PRN

## 2016-09-15 NOTE — Discharge Instructions (Signed)
Take predniosone as prescribed until all gone for inflammation. Take tylenol for pain. Flexeril for spasms. Follow up with family doctor for recheck if not improving.

## 2016-09-15 NOTE — ED Triage Notes (Signed)
C/o pain to lower back after lifting approx 80lb bag at work last night-NAD-steady gait

## 2016-09-15 NOTE — ED Provider Notes (Signed)
[facility] [facility] Provider Note   CSN: [id number] Arrival date & time: 09/15/16  1831     History   Chief Complaint Chief Complaint  Patient presents with  . Back Pain    HPI Jose [name] is a 56 y.o. male.  HPI Jose [name] is a 56 y.o. male presents to emergency department complaining of low back pain. Patient states that he was lifting a heavy load of recycling paper and throwing them in a trash can yesterday evening. He states that is when he felt sharp pain in the lower back. He states that since then he has had pain over lower back and shooting down right leg. He denies any numbness or weakness in his legs. Denies trouble controlling his bladder or bowels. Denies any fever. No IV drug use. He has taken meloxicam which did not help. He denies any prior back issues  Past Medical History:  Diagnosis Date  . 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 ANXIETY DEPRESSION 03/09/2009  . ARTHRALGIA 11/13/2008  . Asthma   . DEPRESSION 03/09/2009  . Dermatophytosis of groin and perianal area 05/16/2007  . GERD 09/13/2008  . HEMATURIA, HX OF 05/28/2009  . History of ETOH abuse     Patient Active Problem List   Diagnosis Date Noted  . Asthma with acute exacerbation 04/02/2013  . Prostate cancer ([place]) 07/21/2010  . ACQUIRED COAGULATION FACTOR DEFICIENCY 05/28/2009  . HEMATURIA, HX OF 05/28/2009  . ANXIETY DEPRESSION 03/09/2009  . DEPRESSION 03/09/2009  . ARTHRALGIA 11/13/2008  . GERD 09/13/2008  . DERMATOPHYTOSIS OF GROIN AND PERIANAL AREA 05/16/2007    History reviewed. No pertinent surgical history.     Home Medications    Prior to Admission medications   Medication Sig Start Date End Date Taking? Authorizing Provider  acyclovir (ZOVIRAX) 200 MG capsule TAKE 5 CAPSULES DAILY X 3 DAYS FOR OUTBREAK AS NEEDED 08/25/16   Marletta Lor, MD  ibuprofen (ADVIL,MOTRIN) 200 MG tablet Take 400 mg by mouth every 6 (six) hours as needed. pain     [provider]  meloxicam (MOBIC) 7.5 MG tablet TAKE 1 TABLET (7.5 MG TOTAL) BY MOUTH DAILY. 09/09/16   Marletta Lor, MD    Family History Family History  Problem Relation Age of Onset  . Diabetes Mother   . Hypertension Mother   . Cancer Mother     Social History Social History  Substance Use Topics  . Smoking status: Former Smoker    Packs/day: 0.20    Types: Cigarettes  . Smokeless tobacco: Former Systems developer  . Alcohol use No     Allergies   Patient has no known allergies.   Review of Systems Review of Systems  Constitutional: Negative for chills and fever.  Respiratory: Negative for cough, chest tightness and shortness of breath.   Cardiovascular: Negative for chest pain, palpitations and leg swelling.  Gastrointestinal: Negative for abdominal distention, abdominal pain, diarrhea, nausea and vomiting.  Genitourinary: Negative for difficulty urinating, dysuria, frequency, hematuria and urgency.  Musculoskeletal: Positive for back pain. Negative for arthralgias, myalgias, neck pain and neck stiffness.  Skin: Negative for rash.  Allergic/Immunologic: Negative for immunocompromised state.  Neurological: Negative for weakness and numbness.  All other systems reviewed and are negative.    Physical Exam Updated Vital Signs BP 114/85 (BP Location: Left Arm)   Pulse 79   Temp 98.8 F (37.1 C) (Oral)   Resp 16   Ht 5\' 8"  (1.727 m)   Wt 84.9 kg (187  lb 4 oz)   SpO2 96%   BMI 28.47 kg/m   Physical Exam  Constitutional: He is oriented to person, place, and time. He appears well-developed and well-nourished. No distress.  HENT:  Head: Normocephalic and atraumatic.  Eyes: Conjunctivae are normal.  Neck: Neck supple.  Cardiovascular: Normal rate, regular rhythm and normal heart sounds.   Pulmonary/Chest: Effort normal. No respiratory distress. He has no wheezes. He has no rales.  Abdominal: Soft. Bowel sounds are normal. He exhibits no distension. There is  no tenderness. There is no rebound.  Musculoskeletal: He exhibits no edema.  Tenderness to palpation over midline lower lumbar spine and right SI joints. Tenderness extends into the right buttock. Pain with right straight leg raise.  Neurological: He is alert and oriented to person, place, and time.  5/5 and equal lower extremity strength. 2+ and equal patellar reflexes bilaterally. Pt able to dorsiflex bilateral toes and feet with good strength against resistance. Equal sensation bilaterally over thighs and lower legs.   Skin: Skin is warm and dry.  Nursing note and vitals reviewed.    ED Treatments / Results  Labs (all labs ordered are listed, but only abnormal results are displayed) Labs Reviewed - No data to display  EKG  EKG Interpretation None       Radiology No results found.  Procedures Procedures (including critical care time)  Medications Ordered in ED Medications  ketorolac (TORADOL) injection 30 mg (not administered)     Initial Impression / Assessment and Plan / ED Course  I have reviewed the triage vital signs and the nursing notes.  Pertinent labs & imaging results that were available during my care of the patient were reviewed by me and considered in my medical decision making (see chart for details).     Patient with lumbar sacral radiculopathy after lifting heavy recycling papers yesterday. Normal neurological exam. No red flags to suggest cauda equina. Will treat with a 5 day burst of prednisone. Will also prescribe muscle relaxant. Follow-up with family doctor. Return precautions discussed. No imaging indicated today on emergent basis.  Vitals:   09/15/16 1838  BP: 114/85  Pulse: 79  Resp: 16  Temp: 98.8 F (37.1 C)  TempSrc: Oral  SpO2: 96%  Weight: 84.9 kg (187 lb 4 oz)  Height: 5\' 8"  (1.727 m)     Final Clinical Impressions(s) / ED Diagnoses   Final diagnoses:  None    New Prescriptions New Prescriptions   No medications on file      Jeannett Senior, PA-C 09/15/16 2022    Gareth Morgan, MD 09/18/16 1408

## 2016-09-19 ENCOUNTER — Ambulatory Visit (INDEPENDENT_AMBULATORY_CARE_PROVIDER_SITE_OTHER): Payer: 59 | Admitting: Internal Medicine

## 2016-09-19 ENCOUNTER — Encounter: Payer: Self-pay | Admitting: Internal Medicine

## 2016-09-19 VITALS — BP 104/62 | HR 75 | Temp 98.0°F | Ht 68.0 in | Wt 185.6 lb

## 2016-09-19 DIAGNOSIS — M5441 Lumbago with sciatica, right side: Secondary | ICD-10-CM

## 2016-09-19 NOTE — Patient Instructions (Addendum)
Most patients with low back pain will improve with time over the next two to 6 weeks.  Keep active but avoid any activities that cause pain.  Apply moist heat to the low back area several times daily.   Back Exercises If you have pain in your back, do these exercises 2-3 times each day or as told by your doctor. When the pain goes away, do the exercises once each day, but repeat the steps more times for each exercise (do more repetitions). If you do not have pain in your back, do these exercises once each day or as told by your doctor. Exercises Single Knee to Chest  Do these steps 3-5 times in a row for each leg: 1. Lie on your back on a firm bed or the floor with your legs stretched out. 2. Bring one knee to your chest. 3. Hold your knee to your chest by grabbing your knee or thigh. 4. Pull on your knee until you feel a gentle stretch in your lower back. 5. Keep doing the stretch for 10-30 seconds. 6. Slowly let go of your leg and straighten it.  Pelvic Tilt  Do these steps 5-10 times in a row: 1. Lie on your back on a firm bed or the floor with your legs stretched out. 2. Bend your knees so they point up to the ceiling. Your feet should be flat on the floor. 3. Tighten your lower belly (abdomen) muscles to press your lower back against the floor. This will make your tailbone point up to the ceiling instead of pointing down to your feet or the floor. 4. Stay in this position for 5-10 seconds while you gently tighten your muscles and breathe evenly.  Cat-Cow  Do these steps until your lower back bends more easily: 1. Get on your hands and knees on a firm surface. Keep your hands under your shoulders, and keep your knees under your hips. You may put padding under your knees. 2. Let your head hang down, and make your tailbone point down to the floor so your lower back is round like the back of a cat. 3. Stay in this position for 5 seconds. 4. Slowly lift your head and make your tailbone  point up to the ceiling so your back hangs low (sags) like the back of a cow. 5. Stay in this position for 5 seconds.  Press-Ups  Do these steps 5-10 times in a row: 1. Lie on your belly (face-down) on the floor. 2. Place your hands near your head, about shoulder-width apart. 3. While you keep your back relaxed and keep your hips on the floor, slowly straighten your arms to raise the top half of your body and lift your shoulders. Do not use your back muscles. To make yourself more comfortable, you may change where you place your hands. 4. Stay in this position for 5 seconds. 5. Slowly return to lying flat on the floor.  Bridges  Do these steps 10 times in a row: 1. Lie on your back on a firm surface. 2. Bend your knees so they point up to the ceiling. Your feet should be flat on the floor. 3. Tighten your butt muscles and lift your butt off of the floor until your waist is almost as high as your knees. If you do not feel the muscles working in your butt and the back of your thighs, slide your feet 1-2 inches farther away from your butt. 4. Stay in this position for 3-5  seconds. 5. Slowly lower your butt to the floor, and let your butt muscles relax.  If this exercise is too easy, try doing it with your arms crossed over your chest. Belly Crunches  Do these steps 5-10 times in a row: 1. Lie on your back on a firm bed or the floor with your legs stretched out. 2. Bend your knees so they point up to the ceiling. Your feet should be flat on the floor. 3. Cross your arms over your chest. 4. Tip your chin a little bit toward your chest but do not bend your neck. 5. Tighten your belly muscles and slowly raise your chest just enough to lift your shoulder blades a tiny bit off of the floor. 6. Slowly lower your chest and your head to the floor.  Back Lifts Do these steps 5-10 times in a row: 1. Lie on your belly (face-down) with your arms at your sides, and rest your forehead on the  floor. 2. Tighten the muscles in your legs and your butt. 3. Slowly lift your chest off of the floor while you keep your hips on the floor. Keep the back of your head in line with the curve in your back. Look at the floor while you do this. 4. Stay in this position for 3-5 seconds. 5. Slowly lower your chest and your face to the floor.  Contact a doctor if:  Your back pain gets a lot worse when you do an exercise.  Your back pain does not lessen 2 hours after you exercise. If you have any of these problems, stop doing the exercises. Do not do them again unless your doctor says it is okay. Get help right away if:  You have sudden, very bad back pain. If this happens, stop doing the exercises. Do not do them again unless your doctor says it is okay. This information is not intended to replace advice given to you by your health care provider. Make sure you discuss any questions you have with your health care provider. Document Released: 01/22/2010 Document Revised: 05/28/2015 Document Reviewed: 02/13/2014 Elsevier Interactive Patient Education  2018 [employer] Ask your health care provider which exercises are safe for you. Do exercises exactly as told by your health care provider and adjust them as directed. It is normal to feel mild stretching, pulling, tightness, or discomfort as you do these exercises, but you should stop right away if you feel sudden pain or your pain gets worse. Do not begin these exercises until told by your health care provider. Stretching and range of motion exercises These exercises warm up your muscles and joints and improve the movement and flexibility of your back. These exercises also help to relieve pain, numbness, and tingling. Exercise A: Lumbar rotation  1. Lie on your back on a firm surface and bend your knees. 2. Straighten your arms out to your sides so each arm forms an "L" shape with a side of your body (a 90 degree  angle). 3. Slowly move both of your knees to one side of your body until you feel a stretch in your lower back. Try not to let your shoulders move off of the floor. 4. Hold for __________ seconds. 5. Tense your abdominal muscles and slowly move your knees back to the starting position. 6. Repeat this exercise on the other side of your body. Repeat __________ times. Complete this exercise __________ times a day. Exercise B: Prone extension on elbows  1. Lie on your abdomen on a firm surface. 2. Prop yourself up on your elbows. 3. Use your arms to help lift your chest up until you feel a gentle stretch in your abdomen and your lower back. ? This will place some of your body weight on your elbows. If this is uncomfortable, try stacking pillows under your chest. ? Your hips should stay down, against the surface that you are lying on. Keep your hip and back muscles relaxed. 4. Hold for __________ seconds. 5. Slowly relax your upper body and return to the starting position. Repeat __________ times. Complete this exercise __________ times a day. Strengthening exercises These exercises build strength and endurance in your back. Endurance is the ability to use your muscles for a long time, even after they get tired. Exercise C: Pelvic tilt 1. Lie on your back on a firm surface. Bend your knees and keep your feet flat. 2. Tense your abdominal muscles. Tip your pelvis up toward the ceiling and flatten your lower back into the floor. ? To help with this exercise, you may place a small towel under your lower back and try to push your back into the towel. 3. Hold for __________ seconds. 4. Let your muscles relax completely before you repeat this exercise. Repeat __________ times. Complete this exercise __________ times a day. Exercise D: Alternating arm and leg raises  1. Get on your hands and knees on a firm surface. If you are on a hard floor, you may want to use padding to cushion your knees, such as  an exercise mat. 2. Line up your arms and legs. Your hands should be below your shoulders, and your knees should be below your hips. 3. Lift your left leg behind you. At the same time, raise your right arm and straighten it in front of you. ? Do not lift your leg higher than your hip. ? Do not lift your arm higher than your shoulder. ? Keep your abdominal and back muscles tight. ? Keep your hips facing the ground. ? Do not arch your back. ? Keep your balance carefully, and do not hold your breath. 4. Hold for __________ seconds. 5. Slowly return to the starting position and repeat with your right leg and your left arm. Repeat __________ times. Complete this exercise __________ times a day. Exercise E: Abdominal set with straight leg raise  1. Lie on your back on a firm surface. 2. Bend one of your knees and keep your other leg straight. 3. Tense your abdominal muscles and lift your straight leg up, 4-6 inches (10-15 cm) off the ground. 4. Keep your abdominal muscles tight and hold for __________ seconds. ? Do not hold your breath. ? Do not arch your back. Keep it flat against the ground. 5. Keep your abdominal muscles tense as you slowly lower your leg back to the starting position. 6. Repeat with your other leg. Repeat __________ times. Complete this exercise __________ times a day. Posture and body mechanics  Body mechanics refers to the movements and positions of your body while you do your daily activities. Posture is part of body mechanics. Good posture and healthy body mechanics can help to relieve stress in your body's tissues and joints. Good posture means that your spine is in its natural S-curve position (your spine is neutral), your shoulders are pulled back slightly, and your head is not tipped forward. The following are general guidelines for applying improved posture and body mechanics to your everyday activities. Standing  When standing, keep your spine neutral and your  feet about hip-width apart. Keep a slight bend in your knees. Your ears, shoulders, and hips should line up.  When you do a task in which you stand in one place for a long time, place one foot up on a stable object that is 2-4 inches (5-10 cm) high, such as a footstool. This helps keep your spine neutral. Sitting   When sitting, keep your spine neutral and keep your feet flat on the floor. Use a footrest, if necessary, and keep your thighs parallel to the floor. Avoid rounding your shoulders, and avoid tilting your head forward.  When working at a desk or a computer, keep your desk at a height where your hands are slightly lower than your elbows. Slide your chair under your desk so you are close enough to maintain good posture.  When working at a computer, place your monitor at a height where you are looking straight ahead and you do not have to tilt your head forward or downward to look at the screen. Resting   When lying down and resting, avoid positions that are most painful for you.  If you have pain with activities such as sitting, bending, stooping, or squatting (flexion-based activities), lie in a position in which your body does not bend very much. For example, avoid curling up on your side with your arms and knees near your chest (fetal position).  If you have pain with activities such as standing for a long time or reaching with your arms (extension-based activities), lie with your spine in a neutral position and bend your knees slightly. Try the following positions:  Lying on your side with a pillow between your knees.  Lying on your back with a pillow under your knees. Lifting   When lifting objects, keep your feet at least shoulder-width apart and tighten your abdominal muscles.  Bend your knees and hips and keep your spine neutral. It is important to lift using the strength of your legs, not your back. Do not lock your knees straight out.  Always ask for help to lift heavy  or awkward objects.   Call or return to clinic prn if these symptoms worsen or fail to improve as anticipated.

## 2016-09-19 NOTE — Progress Notes (Signed)
Subjective:    Patient ID: Jose [name], male    DOB: 1960/07/23, 56 y.o.   MRN: [id number]  HPI 56 year old patient who is seen today in follow-up with a chief complaint of lumbar pain of 5 days' duration.  Pain began at work while doing some heavy lifting.  He was pulling on a heavy trash bag and attempted to sling the bag into a container.  He noted acute pain in the left hip and right lumbar area. He was seen in the ED on 913.  the following day and has been treated with prednisone as well as Flexeril.  The patient wonders whether an MRI would be appropriate.  He has return to work for a single day and has been limited to 5 pounds lifting, only.  His pain has improved In the ED, he had a positive straight leg test on the right and had a suspected mild right lumbar radiculopathy.  He presently has no radicular symptoms  Past Medical History:  Diagnosis Date  . 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 ANXIETY DEPRESSION 03/09/2009  . ARTHRALGIA 11/13/2008  . Asthma   . DEPRESSION 03/09/2009  . Dermatophytosis of groin and perianal area 05/16/2007  . GERD 09/13/2008  . HEMATURIA, HX OF 05/28/2009  . History of ETOH abuse      Social History   Social History  . Marital status: Married    Spouse name: N/A  . Number of children: N/A  . Years of education: N/A   Occupational History  . Not on file.   Social History Main Topics  . Smoking status: Former Smoker    Packs/day: 0.20    Types: Cigarettes  . Smokeless tobacco: Former Systems developer  . Alcohol use No  . Drug use: No  . Sexual activity: Not on file   Other Topics Concern  . Not on file   Social History Narrative  . No narrative on file    No past surgical history on file.  Family History  Problem Relation Age of Onset  . Diabetes Mother   . Hypertension Mother   . Cancer Mother     No Known Allergies  Current Outpatient Prescriptions on File Prior to Visit  Medication Sig Dispense Refill  .  acyclovir (ZOVIRAX) 200 MG capsule TAKE 5 CAPSULES DAILY X 3 DAYS FOR OUTBREAK AS NEEDED 30 capsule 5  . meloxicam (MOBIC) 7.5 MG tablet TAKE 1 TABLET (7.5 MG TOTAL) BY MOUTH DAILY. 90 tablet 0  . predniSONE (DELTASONE) 10 MG tablet Take 5 tablets (50 mg total) by mouth daily. 25 tablet 0   No current facility-administered medications on file prior to visit.     BP 104/62 (BP Location: Left Arm, Patient Position: Sitting, Cuff Size: Normal)   Pulse 75   Temp 98 F (36.7 C) (Oral)   Ht 5\' 8"  (1.727 m)   Wt 185 lb 9.6 oz (84.2 kg)   SpO2 98%   BMI 28.22 kg/m      Review of Systems  Constitutional: Negative for appetite change, chills, fatigue and fever.  HENT: Negative for congestion, dental problem, ear pain, hearing loss, sore throat, tinnitus, trouble swallowing and voice change.   Eyes: Negative for pain, discharge and visual disturbance.  Respiratory: Negative for cough, chest tightness, wheezing and stridor.   Cardiovascular: Negative for chest pain, palpitations and leg swelling.  Gastrointestinal: Negative for abdominal distention, abdominal pain, blood in stool, constipation, diarrhea, nausea and vomiting.  Genitourinary: Negative for difficulty urinating,  discharge, flank pain, genital sores, hematuria and urgency.  Musculoskeletal: Positive for back pain. Negative for arthralgias, gait problem, joint swelling, myalgias and neck stiffness.  Skin: Negative for rash.  Neurological: Negative for dizziness, syncope, speech difficulty, weakness, numbness and headaches.  Hematological: Negative for adenopathy. Does not bruise/bleed easily.  Psychiatric/Behavioral: Negative for behavioral problems and dysphoric mood. The patient is not nervous/anxious.        Objective:   Physical Exam  Constitutional: He appears well-developed and well-nourished. He appears distressed.  Musculoskeletal:  Mild tenderness in the right lumbar area Patient able to walk on his toes and heels  without difficult Patellar and Achilles reflexes brisk and equal  Straight leg test is now negative Range of motion of the right hip was slightly uncomfortable, especially external rotation          Assessment & Plan:   Right lumbosacral strain.  Will complete his prednisone therapy.  If needed, we'll then substitute mobic.  He will rest, apply warm compresses and slowly resume his usual activities.  He was giving stretching/back exercises to perform.  He will call if he develops any new or worsening symptoms. He states that he will consider FMLA due to the back pain  [name]

## 2016-09-22 ENCOUNTER — Encounter: Payer: Self-pay | Admitting: Internal Medicine

## 2016-09-23 ENCOUNTER — Encounter: Payer: Self-pay | Admitting: Internal Medicine

## 2016-09-23 ENCOUNTER — Ambulatory Visit (INDEPENDENT_AMBULATORY_CARE_PROVIDER_SITE_OTHER): Payer: 59 | Admitting: Internal Medicine

## 2016-09-23 VITALS — BP 112/62 | HR 75 | Temp 98.5°F | Ht 68.0 in | Wt 186.2 lb

## 2016-09-23 DIAGNOSIS — Z23 Encounter for immunization: Secondary | ICD-10-CM | POA: Diagnosis not present

## 2016-09-23 DIAGNOSIS — S39012S Strain of muscle, fascia and tendon of lower back, sequela: Secondary | ICD-10-CM

## 2016-09-23 NOTE — Patient Instructions (Addendum)
Lumbosacral Strain Lumbosacral strain is an injury that causes pain in the lower back (lumbosacral spine). This injury usually occurs from overstretching the muscles or ligaments along your spine. A strain can affect one or more muscles or cord-like tissues that connect bones to other bones (ligaments). What are the causes? This condition may be caused by:  A hard, direct hit (blow) to the back.  Excessive stretching of the lower back muscles. This may result from: ? A fall. ? Lifting something heavy. ? Repetitive movements such as bending or crouching.  What increases the risk? The following factors may increase your risk of getting this condition:  Participating in sports or activities that involve: ? A sudden twist of the back. ? Pushing or pulling motions.  Being overweight or obese.  Having poor strength and flexibility, especially tight hamstrings or weak muscles in the back or abdomen.  Having too much of a curve in the lower back.  Having a pelvis that is tilted forward.  What are the signs or symptoms? The main symptom of this condition is pain in the lower back, at the site of the strain. Pain may extend (radiate) down one or both legs. How is this diagnosed? This condition is diagnosed based on:  Your symptoms.  Your medical history.  A physical exam. ? Your health care provider may push on certain areas of your back to determine the source of your pain. ? You may be asked to bend forward, backward, and side to side to assess the severity of your pain and your range of motion.  Imaging tests, such as: ? X-rays. ? MRI.  How is this treated? Treatment for this condition may include:  Putting heat and cold on the affected area.  Medicines to help relieve pain and relax your muscles (muscle relaxants).  NSAIDs to help reduce swelling and discomfort.  When your symptoms improve, it is important to gradually return to your normal routine as soon as possible  to reduce pain, avoid stiffness, and avoid loss of muscle strength. Generally, symptoms should improve within 6 weeks of treatment. However, recovery time varies. Follow these instructions at home: Managing pain, stiffness, and swelling   If directed, put ice on the injured area during the first 24 hours after your strain. ? Put ice in a plastic bag. ? Place a towel between your skin and the bag. ? Leave the ice on for 20 minutes, 2-3 times a day.  If directed, put heat on the affected area as often as told by your health care provider. Use the heat source that your health care provider recommends, such as a moist heat pack or a heating pad. ? Place a towel between your skin and the heat source. ? Leave the heat on for 20-30 minutes. ? Remove the heat if your skin turns bright red. This is especially important if you are unable to feel pain, heat, or cold. You may have a greater risk of getting burned. Activity  Rest and return to your normal activities as told by your health care provider. Ask your health care provider what activities are safe for you.  Avoid activities that take a lot of energy for as long as told by your health care provider. General instructions  Take over-the-counter and prescription medicines only as told by your health care provider.  Donot drive or use heavy machinery while taking prescription pain medicine.  Do not use any products that contain nicotine or tobacco, such as cigarettes and e-cigarettes.   If you need help quitting, ask your health care provider.  Keep all follow-up visits as told by your health care provider. This is important. How is this prevented?  Use correct form when playing sports and lifting heavy objects.  Use good posture when sitting and standing.  Maintain a healthy weight.  Sleep on a mattress with medium firmness to support your back.  Be safe and responsible while being active to avoid falls.  Do at least 150 minutes of  moderate-intensity exercise each week, such as brisk walking or water aerobics. Try a form of exercise that takes stress off your back, such as swimming or stationary cycling.  Maintain physical fitness, including: ? Strength. ? Flexibility. ? Cardiovascular fitness. ? Endurance. Contact a health care provider if:  Your back pain does not improve after 6 weeks of treatment.  Your symptoms get worse. Get help right away if:  Your back pain is severe.  You cannot stand or walk.  You have difficulty controlling when you urinate or when you have a bowel movement.  You feel nauseous or you vomit.  Your feet get very cold.  You have numbness, tingling, weakness, or problems using your arms or legs.  You develop any of the following: ? Shortness of breath. ? Dizziness. ? Pain in your legs. ? Weakness in your buttocks or legs. ? Discoloration of the skin on your toes or legs. This information is not intended to replace advice given to you by your health care provider. Make sure you discuss any questions you have with your health care provider. Document Released: 09/29/2004 Document Revised: 07/10/2015 Document Reviewed: 05/24/2015 Elsevier Interactive Patient Education  2017 [employer].

## 2016-09-23 NOTE — Progress Notes (Signed)
Subjective:    Patient ID: Jose [name], male    DOB: 1960/06/26, 56 y.o.   MRN: [id number]  HPI  56 year old patient who sustained acute low back pain while doing heavy lifting at work on September 13. He was seen in the ED later that day and was seen here in follow-up on September 17. He return to work on September 15 and again was at work yesterday.  After several hours, he had worsening low back pain which persists today. He was placed on light duty upon his return. He is seen today for completion of FMLA forms and also additional forms to continue with light duty.  He also required a note to Excuse from work and to return after the weekend He continues to have pain in the lumbar area bilaterally.  No radicular symptoms.  Pain is aggravated by bending, stooping, lifting and twisting.  The patient does do custodial work.   FMLA and other forms completed  Past Medical History:  Diagnosis Date  . 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 ANXIETY DEPRESSION 03/09/2009  . ARTHRALGIA 11/13/2008  . Asthma   . DEPRESSION 03/09/2009  . Dermatophytosis of groin and perianal area 05/16/2007  . GERD 09/13/2008  . HEMATURIA, HX OF 05/28/2009  . History of ETOH abuse      Social History   Social History  . Marital status: Married    Spouse name: N/A  . Number of children: N/A  . Years of education: N/A   Occupational History  . Not on file.   Social History Main Topics  . Smoking status: Former Smoker    Packs/day: 0.20    Types: Cigarettes  . Smokeless tobacco: Former Systems developer  . Alcohol use No  . Drug use: No  . Sexual activity: Not on file   Other Topics Concern  . Not on file   Social History Narrative  . No narrative on file    No past surgical history on file.  Family History  Problem Relation Age of Onset  . Diabetes Mother   . Hypertension Mother   . Cancer Mother     No Known Allergies  Current Outpatient Prescriptions on File Prior to Visit    Medication Sig Dispense Refill  . acyclovir (ZOVIRAX) 200 MG capsule TAKE 5 CAPSULES DAILY X 3 DAYS FOR OUTBREAK AS NEEDED 30 capsule 5  . meloxicam (MOBIC) 7.5 MG tablet TAKE 1 TABLET (7.5 MG TOTAL) BY MOUTH DAILY. 90 tablet 0  . predniSONE (DELTASONE) 10 MG tablet Take 5 tablets (50 mg total) by mouth daily. 25 tablet 0   No current facility-administered medications on file prior to visit.     BP 112/62 (BP Location: Left Arm, Patient Position: Sitting, Cuff Size: Normal)   Pulse 75   Temp 98.5 F (36.9 C) (Oral)   Ht 5\' 8"  (1.727 m)   Wt 186 lb 3.2 oz (84.5 kg)   SpO2 97%   BMI 28.31 kg/m       Review of Systems  Constitutional: Negative for appetite change, chills, fatigue and fever.  HENT: Negative for congestion, dental problem, ear pain, hearing loss, sore throat, tinnitus, trouble swallowing and voice change.   Eyes: Negative for pain, discharge and visual disturbance.  Respiratory: Negative for cough, chest tightness, wheezing and stridor.   Cardiovascular: Negative for chest pain, palpitations and leg swelling.  Gastrointestinal: Negative for abdominal distention, abdominal pain, blood in stool, constipation, diarrhea, nausea and vomiting.  Genitourinary: Negative for difficulty  urinating, discharge, flank pain, genital sores, hematuria and urgency.  Musculoskeletal: Positive for back pain. Negative for arthralgias, gait problem, joint swelling, myalgias and neck stiffness.  Skin: Negative for rash.  Neurological: Negative for dizziness, syncope, speech difficulty, weakness, numbness and headaches.  Hematological: Negative for adenopathy. Does not bruise/bleed easily.  Psychiatric/Behavioral: Negative for behavioral problems and dysphoric mood. The patient is not nervous/anxious.        Objective:   Physical Exam  Constitutional: He appears well-developed and well-nourished. No distress.  Musculoskeletal:  .  No focal tenderness in the lumbosacral area Negative  straight leg test Normal gait          Assessment & Plan:   Lumbosacral strain.  Patient information supplied.  He will rest, apply warm compresses and perform gentle stretching and range of motion activities He will be given a leave of absence until September 25.  He return to work at that time with light duty for an additional week FMLA forms completed  Return here in one month if unimproved  [name]

## 2016-09-26 ENCOUNTER — Ambulatory Visit: Payer: Self-pay | Admitting: Internal Medicine

## 2016-10-20 ENCOUNTER — Ambulatory Visit: Payer: 59 | Admitting: Internal Medicine

## 2016-10-24 ENCOUNTER — Encounter: Payer: Self-pay | Admitting: Internal Medicine

## 2016-10-24 ENCOUNTER — Ambulatory Visit (INDEPENDENT_AMBULATORY_CARE_PROVIDER_SITE_OTHER): Payer: 59 | Admitting: Internal Medicine

## 2016-10-24 VITALS — BP 108/64 | HR 97 | Temp 98.7°F | Ht 68.0 in | Wt 184.6 lb

## 2016-10-24 DIAGNOSIS — M549 Dorsalgia, unspecified: Secondary | ICD-10-CM | POA: Diagnosis not present

## 2016-10-24 DIAGNOSIS — G8929 Other chronic pain: Secondary | ICD-10-CM | POA: Diagnosis not present

## 2016-10-24 NOTE — Progress Notes (Signed)
Subjective:    Patient ID: Jose [name], male    DOB: [birth year], 56 y.o.   MRN: [id number]  HPI  56 year old patient who is seen today for follow-up of low back pain.  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  No radicular symptoms.  Pain is aggravated by bending and stooping and activities and alleviated by rest. [employer] forms were completed last visit, but he states that these forms.  Got him out of work for 3 days only. Accompanied  by his wife today  Past Medical History:  Diagnosis Date  . 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 ANXIETY DEPRESSION 03/09/2009  . ARTHRALGIA 11/13/2008  . Asthma   . DEPRESSION 03/09/2009  . Dermatophytosis of groin and perianal area 05/16/2007  . GERD 09/13/2008  . HEMATURIA, HX OF 05/28/2009  . History of ETOH abuse      Social History   Social History  . Marital status: Married    Spouse name: N/A  . Number of children: N/A  . Years of education: N/A   Occupational History  . Not on file.   Social History Main Topics  . Smoking status: Former Smoker    Packs/day: 0.20    Types: Cigarettes  . Smokeless tobacco: Former Systems developer  . Alcohol use No  . Drug use: No  . Sexual activity: Not on file   Other Topics Concern  . Not on file   Social History Narrative  . No narrative on file    No past surgical history on file.  Family History  Problem Relation Age of Onset  . Diabetes Mother   . Hypertension Mother   . Cancer Mother     No Known Allergies  Current Outpatient Prescriptions on File Prior to Visit  Medication Sig Dispense Refill  . acyclovir (ZOVIRAX) 200 MG capsule TAKE 5 CAPSULES DAILY X 3 DAYS FOR OUTBREAK AS NEEDED 30 capsule 5  . meloxicam (MOBIC) 7.5 MG tablet TAKE 1 TABLET (7.5 MG TOTAL) BY MOUTH DAILY. 90 tablet 0   No current facility-administered medications on file prior to visit.     BP 108/64 (BP Location: Left Arm, Patient Position: Sitting, Cuff Size: Normal)   Pulse  97   Temp 98.7 F (37.1 C) (Oral)   Ht 5\' 8"  (1.727 m)   Wt 184 lb 9.6 oz (83.7 kg)   SpO2 97%   BMI 28.07 kg/m    Review of Systems  Constitutional: Negative for appetite change, chills, fatigue and fever.  HENT: Negative for congestion, dental problem, ear pain, hearing loss, sore throat, tinnitus, trouble swallowing and voice change.   Eyes: Negative for pain, discharge and visual disturbance.  Respiratory: Negative for cough, chest tightness, wheezing and stridor.   Cardiovascular: Negative for chest pain, palpitations and leg swelling.  Gastrointestinal: Negative for abdominal distention, abdominal pain, blood in stool, constipation, diarrhea, nausea and vomiting.  Genitourinary: Negative for difficulty urinating, discharge, flank pain, genital sores, hematuria and urgency.  Musculoskeletal: Positive for back pain. Negative for arthralgias, gait problem, joint swelling, myalgias and neck stiffness.  Skin: Negative for rash.  Neurological: Negative for dizziness, syncope, speech difficulty, weakness, numbness and headaches.  Hematological: Negative for adenopathy. Does not bruise/bleed easily.  Psychiatric/Behavioral: Negative for behavioral problems and dysphoric mood. The patient is not nervous/anxious.        Objective:   Physical Exam  Constitutional: He appears well-developed and well-nourished. No distress.  Musculoskeletal:  Slight tenderness left lumbar area Right  lumbar musculature, slightly tense compared to the left  Negative straight leg test No motor weakness Good range of motion Range of motion both hips normal          Assessment & Plan:   Intermittent low back pain We'll continue heat therapy, local massage and rest as needed, as well as anti-inflammatory medications  Patient states that he'll be moving to [place] and likely will reestablish with a new physician in the near future  [facility]

## 2016-10-24 NOTE — Patient Instructions (Signed)
You  may move around, but avoid painful motions and activities.  Apply heat  to the sore area for 15 to 20 minutes 3 or 4 times daily for the next two to 3 days.  Call or return to clinic prn if these symptoms worsen or fail to improve as anticipated.

## 2017-02-23 ENCOUNTER — Encounter: Payer: Self-pay | Admitting: Internal Medicine

## 2017-02-23 ENCOUNTER — Ambulatory Visit (INDEPENDENT_AMBULATORY_CARE_PROVIDER_SITE_OTHER): Payer: POS | Admitting: Internal Medicine

## 2017-02-23 VITALS — BP 102/78 | HR 61 | Temp 97.8°F | Ht 70.0 in | Wt 188.6 lb

## 2017-02-23 DIAGNOSIS — Z0000 Encounter for general adult medical examination without abnormal findings: Secondary | ICD-10-CM | POA: Diagnosis not present

## 2017-02-23 DIAGNOSIS — Z8619 Personal history of other infectious and parasitic diseases: Secondary | ICD-10-CM

## 2017-02-23 LAB — CBC WITH DIFFERENTIAL/PLATELET
BASOS ABS: 0.1 10*3/uL (ref 0.0–0.1)
Basophils Relative: 1.4 % (ref 0.0–3.0)
Eosinophils Absolute: 0.4 10*3/uL (ref 0.0–0.7)
Eosinophils Relative: 8.1 % — ABNORMAL HIGH (ref 0.0–5.0)
HCT: 45.4 % (ref 39.0–52.0)
Hemoglobin: 15.4 g/dL (ref 13.0–17.0)
LYMPHS ABS: 1.6 10*3/uL (ref 0.7–4.0)
Lymphocytes Relative: 30.3 % (ref 12.0–46.0)
MCHC: 33.8 g/dL (ref 30.0–36.0)
MCV: 92.6 fl (ref 78.0–100.0)
MONO ABS: 0.6 10*3/uL (ref 0.1–1.0)
MONOS PCT: 11.7 % (ref 3.0–12.0)
Neutro Abs: 2.6 10*3/uL (ref 1.4–7.7)
Neutrophils Relative %: 48.5 % (ref 43.0–77.0)
Platelets: 155 10*3/uL (ref 150.0–400.0)
RBC: 4.9 Mil/uL (ref 4.22–5.81)
RDW: 13.5 % (ref 11.5–15.5)
WBC: 5.3 10*3/uL (ref 4.0–10.5)

## 2017-02-23 LAB — COMPREHENSIVE METABOLIC PANEL
ALK PHOS: 52 U/L (ref 39–117)
ALT: 46 U/L (ref 0–53)
AST: 25 U/L (ref 0–37)
Albumin: 4 g/dL (ref 3.5–5.2)
BILIRUBIN TOTAL: 0.5 mg/dL (ref 0.2–1.2)
BUN: 10 mg/dL (ref 6–23)
CO2: 29 mEq/L (ref 19–32)
Calcium: 9.6 mg/dL (ref 8.4–10.5)
Chloride: 105 mEq/L (ref 96–112)
Creatinine, Ser: 1.02 mg/dL (ref 0.40–1.50)
GFR: 96.45 mL/min (ref >60.00)
Glucose, Bld: 92 mg/dL (ref 70–99)
Potassium: 4.2 mEq/L (ref 3.5–5.1)
Sodium: 140 mEq/L (ref 135–145)
TOTAL PROTEIN: 6.7 g/dL (ref 6.0–8.3)

## 2017-02-23 LAB — LIPID PANEL
CHOLESTEROL: 214 mg/dL — AB (ref 0–200)
HDL: 55.9 mg/dL (ref >39.00)
LDL Cholesterol: 145 mg/dL — ABNORMAL HIGH (ref 0–99)
NONHDL: 158.21
Total CHOL/HDL Ratio: 4
Triglycerides: 65 mg/dL (ref 0.0–149.0)
VLDL: 13 mg/dL (ref 0.0–40.0)

## 2017-02-23 LAB — TSH: TSH: 3.45 u[IU]/mL (ref 0.35–4.50)

## 2017-02-23 NOTE — Progress Notes (Signed)
   Subjective:    Patient ID: Jose [name], male    DOB: July 12, 1960, 57 y.o.   MRN: [id number]  HPI  57 -year-old patient who is seen today for an annual preventive health examination. He has done quite well.  No major concerns or complaints except for some recent mid back pain. 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   He has had 2 prostate biopsies previously, the first positive for prostate cancer but the second biopsy was negative for prostate cancer.   Laboratory studies reviewed from 1 year ago and did reveal a fasting blood sugar of 105  Colonoscopy 2011  Family history both parents died at age 18.  Mother had a history of diabetes, end-stage renal disease and hypertension.  Father died of complications of lung cancer 4 sisters.  History of hypertension  Review of Systems  Constitutional: Negative for appetite change, chills, fatigue and fever.  HENT: Negative for congestion, dental problem, ear pain, hearing loss, sore throat, tinnitus, trouble swallowing and voice change.   Eyes: Negative for pain, discharge and visual disturbance.  Respiratory: Negative for cough, chest tightness, wheezing and stridor.   Cardiovascular: Negative for chest pain, palpitations and leg swelling.  Gastrointestinal: Negative for abdominal distention, abdominal pain, blood in stool, constipation, diarrhea, nausea and vomiting.  Genitourinary: Negative for difficulty urinating, discharge, flank pain, genital sores, hematuria and urgency.  Musculoskeletal: Positive for back pain. Negative for arthralgias, gait problem, joint swelling, myalgias and neck stiffness.  Skin: Negative for rash.  Neurological: Negative for dizziness, syncope, speech difficulty,  weakness, numbness and headaches.  Hematological: Negative for adenopathy. Does not bruise/bleed easily.  Psychiatric/Behavioral: Negative for behavioral problems and dysphoric mood. The patient is not nervous/anxious.        Objective:   Physical Exam  Constitutional: He appears well-developed and well-nourished.  HENT:  Head: Normocephalic and atraumatic.  Right Ear: External ear normal.  Left Ear: External ear normal.  Nose: Nose normal.  Mouth/Throat: Oropharynx is clear and moist.  Eyes: Conjunctivae and EOM are normal. Pupils are equal, round, and reactive to light. No scleral icterus.  Neck: Normal range of motion. Neck supple. No JVD present. No thyromegaly present.  Cardiovascular: Regular rhythm, normal heart sounds and intact distal pulses. Exam reveals no gallop and no friction rub.  No murmur heard. Pulmonary/Chest: Effort normal and breath sounds normal. He exhibits no tenderness.  Abdominal: Soft. Bowel sounds are normal. He exhibits no distension and no mass. There is no tenderness.  Genitourinary: Penis normal.  Musculoskeletal: Normal range of motion. He exhibits no edema or tenderness.  Lymphadenopathy:    He has no cervical adenopathy.  Neurological: He is alert. He has normal reflexes. No cranial nerve deficit. Coordination normal.  Skin: Skin is warm and dry. No rash noted.  Psychiatric: He has a normal mood and affect. His behavior is normal.          Assessment & Plan:   Preventive health examination Intermittent low back pain History of prostate cancer.  Follow-up urology History of positive RPR.  Treated  Will review screening lab Follow-up 1 year or as needed  [name]

## 2017-02-23 NOTE — Patient Instructions (Addendum)
Limit your sodium (Salt) intake    It is important that you exercise regularly, at least 20 minutes 3 to 4 times per week.  If you develop chest pain or shortness of breath seek  medical attention.  Return in 1 year for follow-up  Urology follow-up as scheduled   Health Maintenance, Male A healthy lifestyle and preventive care is important for your health and wellness. Ask your health care provider about what schedule of regular examinations is right for you. What should I know about weight and diet? Eat a Healthy Diet  Eat plenty of vegetables, fruits, whole grains, low-fat dairy products, and lean protein.  Do not eat a lot of foods high in solid fats, added sugars, or salt.  Maintain a Healthy Weight Regular exercise can help you achieve or maintain a healthy weight. You should:  Do at least 150 minutes of exercise each week. The exercise should increase your heart rate and make you sweat (moderate-intensity exercise).  Do strength-training exercises at least twice a week.  Watch Your Levels of Cholesterol and Blood Lipids  Have your blood tested for lipids and cholesterol every 5 years starting at 58 years of age. If you are at high risk for heart disease, you should start having your blood tested when you are 58 years old. You may need to have your cholesterol levels checked more often if: ? Your lipid or cholesterol levels are high. ? You are older than 58 years of age. ? You are at high risk for heart disease.  What should I know about cancer screening? Many types of cancers can be detected early and may often be prevented. Lung Cancer  You should be screened every year for lung cancer if: ? You are a current smoker who has smoked for at least 30 years. ? You are a former smoker who has quit within the past 15 years.  Talk to your health care provider about your screening options, when you should start screening, and how often you should be screened.  Colorectal  Cancer  Routine colorectal cancer screening usually begins at 58 years of age and should be repeated every 5-10 years until you are 58 years old. You may need to be screened more often if early forms of precancerous polyps or small growths are found. Your health care provider may recommend screening at an earlier age if you have risk factors for colon cancer.  Your health care provider may recommend using home test kits to check for hidden blood in the stool.  A small camera at the end of a tube can be used to examine your colon (sigmoidoscopy or colonoscopy). This checks for the earliest forms of colorectal cancer.  Prostate and Testicular Cancer  Depending on your age and overall health, your health care provider may do certain tests to screen for prostate and testicular cancer.  Talk to your health care provider about any symptoms or concerns you have about testicular or prostate cancer.  Skin Cancer  Check your skin from head to toe regularly.  Tell your health care provider about any new moles or changes in moles, especially if: ? There is a change in a mole's size, shape, or color. ? You have a mole that is larger than a pencil eraser.  Always use sunscreen. Apply sunscreen liberally and repeat throughout the day.  Protect yourself by wearing long sleeves, pants, a wide-brimmed hat, and sunglasses when outside.  What should I know about heart disease, diabetes, and high  blood pressure?  If you are 67-63 years of age, have your blood pressure checked every 3-5 years. If you are 45 years of age or older, have your blood pressure checked every year. You should have your blood pressure measured twice-once when you are at a hospital or clinic, and once when you are not at a hospital or clinic. Record the average of the two measurements. To check your blood pressure when you are not at a hospital or clinic, you can use: ? An automated blood pressure machine at a pharmacy. ? A home blood  pressure monitor.  Talk to your health care provider about your target blood pressure.  If you are between 62-95 years old, ask your health care provider if you should take aspirin to prevent heart disease.  Have regular diabetes screenings by checking your fasting blood sugar level. ? If you are at a normal weight and have a low risk for diabetes, have this test once every three years after the age of 10. ? If you are overweight and have a high risk for diabetes, consider being tested at a younger age or more often.  A one-time screening for abdominal aortic aneurysm (AAA) by ultrasound is recommended for men aged 33-75 years who are current or former smokers. What should I know about preventing infection? Hepatitis B If you have a higher risk for hepatitis B, you should be screened for this virus. Talk with your health care provider to find out if you are at risk for hepatitis B infection. Hepatitis C Blood testing is recommended for:  Everyone born from [birth year] through 1966.  Anyone with known risk factors for hepatitis C.  Sexually Transmitted Diseases (STDs)  You should be screened each year for STDs including gonorrhea and chlamydia if: ? You are sexually active and are younger than 58 years of age. ? You are older than 58 years of age and your health care provider tells you that you are at risk for this type of infection. ? Your sexual activity has changed since you were last screened and you are at an increased risk for chlamydia or gonorrhea. Ask your health care provider if you are at risk.  Talk with your health care provider about whether you are at high risk of being infected with HIV. Your health care provider may recommend a prescription medicine to help prevent HIV infection.  What else can I do?  Schedule regular health, dental, and eye exams.  Stay current with your vaccines (immunizations).  Do not use any tobacco products, such as cigarettes, chewing tobacco, and  e-cigarettes. If you need help quitting, ask your health care provider.  Limit alcohol intake to no more than 2 drinks per day. One drink equals 12 ounces of beer, 5 ounces of wine, or 1 ounces of hard liquor.  Do not use street drugs.  Do not share needles.  Ask your health care provider for help if you need support or information about quitting drugs.  Tell your health care provider if you often feel depressed.  Tell your health care provider if you have ever been abused or do not feel safe at home. This information is not intended to replace advice given to you by your health care provider. Make sure you discuss any questions you have with your health care provider. Document Released: 06/18/2007 Document Revised: 08/19/2015 Document Reviewed: 09/23/2014 Elsevier Interactive Patient Education  [employer].

## 2017-02-24 LAB — HEPATITIS C ANTIBODY
Hepatitis C Ab: NONREACTIVE
SIGNAL TO CUT-OFF: 0.01 (ref <1.00)

## 2017-03-16 ENCOUNTER — Ambulatory Visit: Payer: POS | Admitting: Internal Medicine

## 2017-03-16 ENCOUNTER — Encounter: Payer: Self-pay | Admitting: Internal Medicine

## 2017-03-16 VITALS — BP 102/62 | HR 78 | Temp 98.5°F | Wt 188.0 lb

## 2017-03-16 DIAGNOSIS — M722 Plantar fascial fibromatosis: Secondary | ICD-10-CM

## 2017-03-16 NOTE — Patient Instructions (Addendum)
Plantar Fasciitis Plantar fasciitis is a painful foot condition that affects the heel. It occurs when the band of tissue that connects the toes to the heel bone (plantar fascia) becomes irritated. This can happen after exercising too much or doing other repetitive activities (overuse injury). The pain from plantar fasciitis can range from mild irritation to severe pain that makes it difficult for you to walk or move. The pain is usually worse in the morning or after you have been sitting or lying down for a while. What are the causes? This condition may be caused by:  Standing for long periods of time.  Wearing shoes that do not fit.  Doing high-impact activities, including running, aerobics, and ballet.  Being overweight.  Having an abnormal way of walking (gait).  Having tight calf muscles.  Having high arches in your feet.  Starting a new athletic activity.  What are the signs or symptoms? The main symptom of this condition is heel pain. Other symptoms include:  Pain that gets worse after activity or exercise.  Pain that is worse in the morning or after resting.  Pain that goes away after you walk for a few minutes.  How is this diagnosed? This condition may be diagnosed based on your signs and symptoms. Your health care provider will also do a physical exam to check for:  A tender area on the bottom of your foot.  A high arch in your foot.  Pain when you move your foot.  Difficulty moving your foot.  You may also need to have imaging studies to confirm the diagnosis. These can include:  X-rays.  Ultrasound.  MRI.  How is this treated? Treatment for plantar fasciitis depends on the severity of the condition. Your treatment may include:  Rest, ice, and over-the-counter pain medicines to manage your pain.  Exercises to stretch your calves and your plantar fascia.  A splint that holds your foot in a stretched, upward position while you sleep (night  splint).  Physical therapy to relieve symptoms and prevent problems in the future.  Cortisone injections to relieve severe pain.  Extracorporeal shock wave therapy (ESWT) to stimulate damaged plantar fascia with electrical impulses. It is often used as a last resort before surgery.  Surgery, if other treatments have not worked after 12 months.  Follow these instructions at home:  Take medicines only as directed by your health care provider.  Avoid activities that cause pain.  Roll the bottom of your foot over a bag of ice or a bottle of cold water. Do this for 20 minutes, 3-4 times a day.  Perform simple stretches as directed by your health care provider.  Try wearing athletic shoes with air-sole or gel-sole cushions or soft shoe inserts.  Wear a night splint while sleeping, if directed by your health care provider.  Keep all follow-up appointments with your health care provider. How is this prevented?  Do not perform exercises or activities that cause heel pain.  Consider finding low-impact activities if you continue to have problems.  Lose weight if you need to. The best way to prevent plantar fasciitis is to avoid the activities that aggravate your plantar fascia. Contact a health care provider if:  Your symptoms do not go away after treatment with home care measures.  Your pain gets worse.  Your pain affects your ability to move or do your daily activities. This information is not intended to replace advice given to you by your health care provider. Make sure you   discuss any questions you have with your health care provider. Document Released: 09/14/2000 Document Revised: 05/25/2015 Document Reviewed: 10/30/2013 Elsevier Interactive Patient Education  2018 [employer]   Plantar Fasciitis Rehab Ask your health care provider which exercises are safe for you. Do exercises exactly as told by your health care provider and adjust them as directed. It is normal to feel  mild stretching, pulling, tightness, or discomfort as you do these exercises, but you should stop right away if you feel sudden pain or your pain gets worse. Do not begin these exercises until told by your health care provider. Stretching and range of motion exercises These exercises warm up your muscles and joints and improve the movement and flexibility of your foot. These exercises also help to relieve pain. Exercise A: Plantar fascia stretch  1. Sit with your left / right leg crossed over your opposite knee. 2. Hold your heel with one hand with that thumb near your arch. With your other hand, hold your toes and gently pull them back toward the top of your foot. You should feel a stretch on the bottom of your toes or your foot or both. 3. Hold this stretch for__________ seconds. 4. Slowly release your toes and return to the starting position. Repeat __________ times. Complete this exercise __________ times a day. Exercise B: Gastroc, standing  1. Stand with your hands against a wall. 2. Extend your left / right leg behind you, and bend your front knee slightly. 3. Keeping your heels on the floor and keeping your back knee straight, shift your weight toward the wall without arching your back. You should feel a gentle stretch in your left / right calf. 4. Hold this position for __________ seconds. Repeat __________ times. Complete this exercise __________ times a day. Exercise C: Soleus, standing 1. Stand with your hands against a wall. 2. Extend your left / right leg behind you, and bend your front knee slightly. 3. Keeping your heels on the floor, bend your back knee and slightly shift your weight over the back leg. You should feel a gentle stretch deep in your calf. 4. Hold this position for __________ seconds. Repeat __________ times. Complete this exercise __________ times a day. Exercise D: Gastrocsoleus, standing 1. Stand with the ball of your left / right foot on a step. The ball of  your foot is on the walking surface, right under your toes. 2. Keep your other foot firmly on the same step. 3. Hold onto the wall or a railing for balance. 4. Slowly lift your other foot, allowing your body weight to press your heel down over the edge of the step. You should feel a stretch in your left / right calf. 5. Hold this position for __________ seconds. 6. Return both feet to the step. 7. Repeat this exercise with a slight bend in your left / right knee. Repeat __________ times with your left / right knee straight and __________ times with your left / right knee bent. Complete this exercise __________ times a day. Balance exercise This exercise builds your balance and strength control of your arch to help take pressure off your plantar fascia. Exercise E: Single leg stand 1. Without shoes, stand near a railing or in a doorway. You may hold onto the railing or door frame as needed. 2. Stand on your left / right foot. Keep your big toe down on the floor and try to keep your arch lifted. Do not let your foot roll inward. 3. Hold   this position for __________ seconds. 4. If this exercise is too easy, you can try it with your eyes closed or while standing on a pillow. Repeat __________ times. Complete this exercise __________ times a day. This information is not intended to replace advice given to you by your health care provider. Make sure you discuss any questions you have with your health care provider. Document Released: 12/20/2004 Document Revised: 08/25/2015 Document Reviewed: 11/03/2014 Elsevier Interactive Patient Education  2018 Elsevier Inc.   

## 2017-03-16 NOTE — Progress Notes (Signed)
Subjective:    Patient ID: Jose [name], male    DOB: 04/10/1960, 57 y.o.   MRN: [id number]  HPI  57 year old patient who has a long history of recurrent intermittent pain involving primarily the right heel.  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  He also states that he has been seen at the [facility] for this problem.  He states that he is requesting a referral to the [facility] system for further evaluation and management He has been seen locally by a podiatrist. 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 ANXIETY DEPRESSION 03/09/2009  . ARTHRALGIA 11/13/2008  . Asthma   . DEPRESSION 03/09/2009  . Dermatophytosis of groin and perianal area 05/16/2007  . GERD 09/13/2008  . HEMATURIA, HX OF 05/28/2009  . History of ETOH abuse      Social History   Socioeconomic History  . Marital status: Married    Spouse name: Not on file  . Number of children: Not on file  . Years of education: Not on file  . Highest education level: Not on file  Social Needs  . Financial resource strain: Not on file  . Food insecurity - worry: Not on file  . Food insecurity - inability: Not on file  . Transportation needs - medical: Not on file  . Transportation needs - non-medical: Not on file  Occupational History  . Not on file  Tobacco Use  . Smoking status: Former Smoker    Packs/day: 0.20    Types: Cigarettes  . Smokeless tobacco: Former Network engineer and Sexual Activity  . Alcohol use: No  . Drug use: No  . Sexual activity: Not on file  Other Topics Concern  . Not on file  Social History Narrative  . Not on file    History reviewed. No pertinent surgical history.  Family History  Problem Relation Age of Onset  . Diabetes Mother   . Hypertension Mother   . Cancer  Mother     No Known Allergies  Current Outpatient Medications on File Prior to Visit  Medication Sig Dispense Refill  . acyclovir (ZOVIRAX) 200 MG capsule TAKE 5 CAPSULES DAILY X 3 DAYS FOR OUTBREAK AS NEEDED 30 capsule 5   No current facility-administered medications on file prior to visit.     BP 102/62 (BP Location: Left Arm, Patient Position: Sitting, Cuff Size: Large)   Pulse 78   Temp 98.5 F (36.9 C) (Oral)   Wt 188 lb (85.3 kg)   SpO2 95%   BMI 26.98 kg/m     Review of Systems  Constitutional: Negative for appetite change, chills, fatigue and fever.  HENT: Negative for congestion, dental problem, ear pain, hearing loss, sore throat, tinnitus, trouble swallowing and voice change.   Eyes: Negative for pain, discharge and visual disturbance.  Respiratory: Negative for cough, chest tightness, wheezing and stridor.   Cardiovascular: Negative for chest pain, palpitations and leg swelling.  Gastrointestinal: Negative for abdominal distention, abdominal pain, blood in stool, constipation, diarrhea, nausea and vomiting.  Genitourinary: Negative for difficulty urinating, discharge, flank pain, genital sores, hematuria and urgency.  Musculoskeletal: Positive for gait problem. Negative for arthralgias, back pain, joint swelling, myalgias and neck stiffness.       Chronic intermittent  right heel pain  Skin: Negative for rash.  Neurological: Negative for dizziness, syncope, speech difficulty, weakness, numbness and headaches.  Hematological: Negative for adenopathy. Does not bruise/bleed easily.  Psychiatric/Behavioral: Negative for behavioral problems and dysphoric mood. The patient is not nervous/anxious.        Objective:   Physical Exam  Constitutional: He appears well-developed and well-nourished. No distress.  Blood pressure low normal  Musculoskeletal:  Very mild tenderness along the medial aspect of the right heel Some callus formation noted in this area  Flatfeet           Assessment & Plan:   Chronic intermittent right heel pain.  Probably plantar fasciitis but  Patient was given general information as well as information for stretching and rehab exercises.  He will follow-up with the [facility] system if unimproved  [facility]

## 2017-03-20 ENCOUNTER — Telehealth: Payer: Self-pay | Admitting: Internal Medicine

## 2017-03-20 NOTE — Telephone Encounter (Signed)
Copied from [place]. Topic: Quick Communication - Rx Refill/Question >> Mar 20, 2017  3:41 PM Clack, Laban Emperor wrote: Medication: acyclovir (ZOVIRAX) 200 MG capsule [[id number]]    Has the patient contacted their pharmacy? No.   (Agent: If no, request that the patient contact the pharmacy for the refill.)   Preferred Pharmacy (with phone number or street name): [place], [place] [phone] (Phone) [phone] (Fax)  Pt states this is a new pharmacy.   Agent: Please be advised that RX refills may take up to 3 business days. We ask that you follow-up with your pharmacy.

## 2017-03-21 MED ORDER — ACYCLOVIR 200 MG PO CAPS: ORAL_CAPSULE | ORAL | 5 refills | Status: AC

## 2017-03-21 NOTE — Telephone Encounter (Signed)
Last office visit 02/23/17 PCP:  Dr. Burnice Logan Pharmacy: [facility]; [place]  Acyclovir refilled per protocol.

## 2017-06-07 ENCOUNTER — Telehealth: Payer: Self-pay | Admitting: Internal Medicine

## 2017-06-07 NOTE — Telephone Encounter (Signed)
Copied from [place] [id number]. Topic: Quick Communication - Rx Refill/Question >> Jun 07, 2017 12:14 PM Selinda Flavin B, NT wrote: Medication: meloxicam (MOBIC) 7.5 MG tablet  Has the patient contacted their pharmacy? Yes.   (Agent: If no, request that the patient contact the pharmacy for the refill.) (Agent: If yes, when and what did the pharmacy advise?)  Preferred Pharmacy (with phone number or street name): [facility], [place] - [place]: Please be advised that RX refills may take up to 3 business days. We ask that you follow-up with your pharmacy.

## 2017-06-08 NOTE — Telephone Encounter (Signed)
LOV  03/16/17 Dr. Burnice Logan Mobic - pt. Has been on this, but it is not currently on medication list.

## 2017-06-08 NOTE — Telephone Encounter (Signed)
Okay to fill? Please advise 

## 2017-06-12 MED ORDER — MELOXICAM 7.5 MG PO TABS: 7.5000 mg | ORAL_TABLET | Freq: Every day | ORAL | 0 refills | Status: AC

## 2017-06-12 NOTE — Telephone Encounter (Signed)
Medication filled to pharmacy as requested.   

## 2017-06-12 NOTE — Telephone Encounter (Signed)
The dr has approved the mobic.  Can you send in?  [place], [place] - [place] [phone] (Phone) [phone] (Fax)

## 2017-06-12 NOTE — Telephone Encounter (Signed)
OK for RF 

## 2017-07-03 ENCOUNTER — Ambulatory Visit: Payer: Self-pay | Admitting: Internal Medicine

## 2017-11-13 ENCOUNTER — Other Ambulatory Visit: Payer: Self-pay | Admitting: Urology

## 2017-11-13 DIAGNOSIS — C61 Malignant neoplasm of prostate: Secondary | ICD-10-CM

## 2017-12-04 ENCOUNTER — Ambulatory Visit
Admission: RE | Admit: 2017-12-04 | Discharge: 2017-12-04 | Disposition: A | Discharge status: Home / Self Care | Payer: POS | Source: Ambulatory Visit | Attending: Urology | Admitting: Urology

## 2017-12-04 DIAGNOSIS — C61 Malignant neoplasm of prostate: Secondary | ICD-10-CM

## 2017-12-04 MED ORDER — GADOBENATE DIMEGLUMINE 529 MG/ML IV SOLN
17.0000 mL | Freq: Once | INTRAVENOUS | Status: AC | PRN
  Administered 2017-12-04: 17 mL via INTRAVENOUS
